# Patient Record
Sex: Male | Born: 1940 | Race: White | Hispanic: No | Marital: Married | Smoking: Never smoker
Health system: Southern US, Community
[De-identification: ages and names within clinical notes are randomized; demographics above are authoritative.]

## PROBLEM LIST (undated history)

## (undated) DIAGNOSIS — E785 Hyperlipidemia, unspecified: Secondary | ICD-10-CM

## (undated) DIAGNOSIS — J029 Acute pharyngitis, unspecified: Secondary | ICD-10-CM

## (undated) DIAGNOSIS — J069 Acute upper respiratory infection, unspecified: Secondary | ICD-10-CM

## (undated) DIAGNOSIS — F329 Major depressive disorder, single episode, unspecified: Secondary | ICD-10-CM

## (undated) DIAGNOSIS — H5440 Blindness, one eye, unspecified eye: Secondary | ICD-10-CM

## (undated) DIAGNOSIS — C801 Malignant (primary) neoplasm, unspecified: Secondary | ICD-10-CM

## (undated) DIAGNOSIS — H539 Unspecified visual disturbance: Secondary | ICD-10-CM

## (undated) DIAGNOSIS — I209 Angina pectoris, unspecified: Secondary | ICD-10-CM

## (undated) DIAGNOSIS — H18519 Endothelial corneal dystrophy, unspecified eye: Secondary | ICD-10-CM

## (undated) DIAGNOSIS — F32A Depression, unspecified: Secondary | ICD-10-CM

## (undated) DIAGNOSIS — M1990 Unspecified osteoarthritis, unspecified site: Secondary | ICD-10-CM

## (undated) DIAGNOSIS — H1851 Endothelial corneal dystrophy: Secondary | ICD-10-CM

## (undated) DIAGNOSIS — C4491 Basal cell carcinoma of skin, unspecified: Secondary | ICD-10-CM

## (undated) HISTORY — DX: Malignant (primary) neoplasm, unspecified: C80.1

## (undated) HISTORY — DX: Depression, unspecified: F32.A

## (undated) HISTORY — DX: Major depressive disorder, single episode, unspecified: F32.9

## (undated) HISTORY — DX: Hyperlipidemia, unspecified: E78.5

## (undated) HISTORY — PX: SKIN CANCER EXCISION: SHX779

## (undated) HISTORY — DX: Acute pharyngitis, unspecified: J02.9

## (undated) HISTORY — DX: Basal cell carcinoma of skin, unspecified: C44.91

## (undated) HISTORY — DX: Blindness, one eye, unspecified eye: H54.40

## (undated) HISTORY — DX: Unspecified visual disturbance: H53.9

## (undated) HISTORY — PX: EYE SURGERY: SHX253

---

## 1964-10-18 HISTORY — PX: HERNIA REPAIR: SHX51

## 1972-10-18 HISTORY — PX: NASAL SINUS SURGERY: SHX719

## 1998-07-16 ENCOUNTER — Encounter: Payer: Self-pay | Admitting: Family Medicine

## 1998-07-16 ENCOUNTER — Ambulatory Visit (HOSPITAL_COMMUNITY): Admission: RE | Admit: 1998-07-16 | Discharge: 1998-07-16 | Payer: Self-pay | Admitting: Family Medicine

## 1998-07-18 ENCOUNTER — Encounter: Admission: RE | Admit: 1998-07-18 | Discharge: 1998-10-16 | Payer: Self-pay | Admitting: Family Medicine

## 2002-05-29 ENCOUNTER — Encounter: Payer: Self-pay | Admitting: Family Medicine

## 2002-05-29 ENCOUNTER — Ambulatory Visit (HOSPITAL_COMMUNITY): Admission: RE | Admit: 2002-05-29 | Discharge: 2002-05-29 | Payer: Self-pay | Admitting: Family Medicine

## 2003-05-07 ENCOUNTER — Encounter: Payer: Self-pay | Admitting: Family Medicine

## 2003-05-07 ENCOUNTER — Ambulatory Visit (HOSPITAL_COMMUNITY): Admission: RE | Admit: 2003-05-07 | Discharge: 2003-05-07 | Payer: Self-pay | Admitting: Family Medicine

## 2011-07-20 ENCOUNTER — Encounter (INDEPENDENT_AMBULATORY_CARE_PROVIDER_SITE_OTHER): Payer: Self-pay | Admitting: Surgery

## 2011-07-23 ENCOUNTER — Ambulatory Visit (INDEPENDENT_AMBULATORY_CARE_PROVIDER_SITE_OTHER): Payer: Medicare Other | Admitting: Surgery

## 2011-08-04 ENCOUNTER — Ambulatory Visit (INDEPENDENT_AMBULATORY_CARE_PROVIDER_SITE_OTHER): Payer: Medicare Other | Admitting: General Surgery

## 2011-08-04 ENCOUNTER — Encounter (INDEPENDENT_AMBULATORY_CARE_PROVIDER_SITE_OTHER): Payer: Self-pay | Admitting: General Surgery

## 2011-08-04 VITALS — BP 138/68 | HR 64 | Temp 97.2°F | Resp 20 | Ht 69.5 in | Wt 193.1 lb

## 2011-08-04 DIAGNOSIS — K429 Umbilical hernia without obstruction or gangrene: Secondary | ICD-10-CM

## 2011-08-04 NOTE — Progress Notes (Signed)
Chief Complaint  Patient presents with  . Other    new pt eval of possible LIH     HPI Michael Pacheco is a 70 y.o. male.  This patient is referred by Dr. Meredith Mody for evaluation of a possible left inguinal hernia. 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 Meredith Mody noticed a possible recurrent hernia in his left inguinal region. He has a history of a prior open left inguinal hernia repair in the 1960s and states that he does not have any problems since then. He denies any bulge in the region or any significant discomfort. He states that his bowels are normal but he does have some occasional constipation which she attributes to his medications. His denies any other obstructive symptoms. HPI  Past Medical History  Diagnosis Date  . Hyperlipidemia   . Allergic rhinitis   . Depression   . Diabetes mellitus     prediadetes  . Blindness of right eye   . Cancer   . Basal cell carcinoma     nose,forehead,ear,shoulder,chest  . Constipation   . Sore throat   . Visual disturbance     Past Surgical History  Procedure Date  . Hernia repair 1966    left  . Nasal sinus surgery 1974    Family History  Problem Relation Age of Onset  . Stroke Mother     Social History History  Substance Use Topics  . Smoking status: Never Smoker   . Smokeless tobacco: Not on file  . Alcohol Use: Yes    Allergies  Allergen Reactions  . Crestor (Rosuvastatin Calcium)   . Lipitor (Atorvastatin Calcium)   . Pravastatin   . Simvastatin   . Talwin Nausea And Vomiting  . Penicillins Rash    Current Outpatient Prescriptions  Medication Sig Dispense Refill  . aspirin 81 MG tablet Take 81 mg by mouth daily.        . beta carotene w/minerals (OCUVITE) tablet Take 1 tablet by mouth daily.        . citalopram (CELEXA) 20 MG tablet Take 20 mg by mouth daily.        Marland Kitchen desonide (DESOWEN) 0.05 % cream       . docusate sodium (COLACE) 100  MG capsule Take 100 mg by mouth once.        . fish oil-omega-3 fatty acids 1000 MG capsule Take 2 g by mouth daily.        Marland Kitchen LIVALO 2 MG TABS       . Red Yeast Rice 600 MG CAPS Take by mouth.          Review of Systems Review of Systems  HENT: Positive for sore throat.   Eyes: Positive for visual disturbance.  Gastrointestinal: Positive for constipation.  All other systems reviewed and are negative.    Blood pressure 138/68, pulse 64, temperature 97.2 F (36.2 C), resp. rate 20, height 5' 9.5" (1.765 m), weight 193 lb 2 oz (87.601 kg).  Physical Exam Physical Exam  Vitals reviewed. Constitutional: He is oriented to person, place, and time. He appears well-developed and well-nourished. No distress.  HENT:  Head: Normocephalic and atraumatic.  Mouth/Throat: No oropharyngeal exudate.  Eyes: Conjunctivae and EOM are normal. Pupils are equal, round, and reactive to light. No scleral icterus.  Neck: Normal range of motion. Neck supple. No tracheal deviation present.  Cardiovascular: Normal rate, regular rhythm and normal heart  sounds.   Pulmonary/Chest: No stridor. No respiratory distress. He has no wheezes. He has no rales. He exhibits no tenderness.  Abdominal: Soft. Bowel sounds are normal. He exhibits no distension and no mass. There is no tenderness. There is no rebound and no guarding.       He has a small, reducible umbilical hernia which is nontender on exam today. Without evidence of recurrent left inguinal hernia and no evidence of right inguinal hernia. He does have a midline diastasis.  Musculoskeletal: Normal range of motion. He exhibits no edema and no tenderness.  Neurological: He is alert and oriented to person, place, and time.  Skin: Skin is warm and dry. No rash noted. He is not diaphoretic. No erythema. No pallor.  Psychiatric: He has a normal mood and affect. His behavior is normal. Judgment and thought content normal.    Data Reviewed   Assessment      Umbilical hernia  This is a reducible and small umbilical hernia. It appears to be a symptomatic at this time and no evidence of obstructive symptoms. He also has a mild diastases.  No evidence of recurrent left inguinal hernia.    Plan    I discussed with the patient the options for treatment of umbilical hernia including surgical options of open or laparoscopic repair. I explained that my official recommendation would be for surgical repair of this relatively small umbilical hernia to prevent complications such as incarceration or strangulation. He expressed understanding of these potential risks. I gave him some literature about hernia surgery and he is going to review the literature and decide if he would like to have repair of this hernia. At this point, he is leaning towards not having surgery. I gave him a contact information and he will call me back if he desires to have hernia repair. For this small hernia, I would recommend open umbilical hernia repair.With regard to his inguinal region, I do not appreciate any hernia bilaterally and would recommend continued observation of this.       Lodema Pilot DAVID 08/04/2011, 9:45 AM

## 2011-09-29 ENCOUNTER — Other Ambulatory Visit (INDEPENDENT_AMBULATORY_CARE_PROVIDER_SITE_OTHER): Payer: Self-pay | Admitting: General Surgery

## 2011-09-29 ENCOUNTER — Telehealth (INDEPENDENT_AMBULATORY_CARE_PROVIDER_SITE_OTHER): Payer: Self-pay

## 2011-09-29 NOTE — Telephone Encounter (Signed)
Patient called and would like to proceed with umbilical hernia repair, spoke with Mr. Pulis and advised patient Dr. Biagio Quint will review and we will call patient back to discuss further.

## 2011-10-13 ENCOUNTER — Encounter (HOSPITAL_COMMUNITY): Payer: Self-pay

## 2011-10-22 ENCOUNTER — Encounter (HOSPITAL_COMMUNITY): Payer: Self-pay

## 2011-10-22 ENCOUNTER — Encounter (HOSPITAL_COMMUNITY)
Admission: RE | Admit: 2011-10-22 | Discharge: 2011-10-22 | Disposition: A | Discharge status: Home / Self Care | Payer: Medicare Other | Source: Ambulatory Visit | Attending: General Surgery | Admitting: General Surgery

## 2011-10-22 HISTORY — DX: Angina pectoris, unspecified: I20.9

## 2011-10-22 HISTORY — DX: Endothelial corneal dystrophy: H18.51

## 2011-10-22 HISTORY — DX: Acute upper respiratory infection, unspecified: J06.9

## 2011-10-22 HISTORY — DX: Unspecified osteoarthritis, unspecified site: M19.90

## 2011-10-22 HISTORY — DX: Endothelial corneal dystrophy, unspecified eye: H18.519

## 2011-10-22 LAB — BASIC METABOLIC PANEL
BUN: 16 mg/dL (ref 6–23)
CO2: 28 mEq/L (ref 19–32)
Chloride: 100 mEq/L (ref 96–112)
Creatinine, Ser: 0.95 mg/dL (ref 0.50–1.35)
Glucose, Bld: 106 mg/dL — ABNORMAL HIGH (ref 70–99)
Potassium: 4.4 mEq/L (ref 3.5–5.1)

## 2011-10-22 LAB — SURGICAL PCR SCREEN: Staphylococcus aureus: NEGATIVE

## 2011-10-22 LAB — CBC
HCT: 45.3 % (ref 39.0–52.0)
Hemoglobin: 15.7 g/dL (ref 13.0–17.0)
MCHC: 34.7 g/dL (ref 30.0–36.0)
MCV: 86.3 fL (ref 78.0–100.0)
RDW: 12.9 % (ref 11.5–15.5)

## 2011-10-22 NOTE — Pre-Procedure Instructions (Signed)
20 Michael Pacheco  10/22/2011   Your procedure is scheduled on:  10/28/2011  Report to [facility] at 9:35 AM.  Call this number if you have problems the morning of surgery: [phone]   Remember:   Do not eat food:After Midnight.  May have clear liquids: up to 4 Hours before arrival.  Clear liquids include soda, tea, black coffee, apple or grape juice, broth.  Take these medicines the morning of surgery with A SIP OF WATER: Celexa   Do not wear jewelry, make-up or nail polish.  Do not wear lotions, powders, or perfumes. You may wear deodorant.  Do not shave 48 hours prior to surgery.  Do not bring valuables to the hospital.  Contacts, dentures or bridgework may not be worn into surgery.  Leave suitcase in the car. After surgery it may be brought to your room.  For patients admitted to the hospital, checkout time is 11:00 AM the day of discharge.   Patients discharged the day of surgery will not be allowed to drive home.  Name and phone number of your driver: wife- Darl Pikes  Special Instructions: CHG Shower Use Special Wash: 1/2 bottle night before surgery and 1/2 bottle morning of surgery.   Please read over the following fact sheets that you were given: Pain Booklet, Coughing and Deep Breathing, MRSA Information and Surgical Site Infection Prevention

## 2011-10-27 MED ORDER — CIPROFLOXACIN IN D5W 400 MG/200ML IV SOLN
400.0000 mg | INTRAVENOUS | Status: AC
  Administered 2011-10-28: 400 mg via INTRAVENOUS
  Filled 2011-10-27: qty 200

## 2011-10-28 ENCOUNTER — Ambulatory Visit (HOSPITAL_COMMUNITY)
Admission: RE | Admit: 2011-10-28 | Discharge: 2011-10-28 | Disposition: A | Discharge status: Home / Self Care | Payer: Medicare Other | Source: Ambulatory Visit | Attending: General Surgery | Admitting: General Surgery

## 2011-10-28 ENCOUNTER — Encounter (HOSPITAL_COMMUNITY): Payer: Self-pay | Admitting: Anesthesiology

## 2011-10-28 ENCOUNTER — Ambulatory Visit (HOSPITAL_COMMUNITY): Payer: Medicare Other | Admitting: Anesthesiology

## 2011-10-28 ENCOUNTER — Encounter (HOSPITAL_COMMUNITY)
Admission: RE | Disposition: A | Discharge status: Home / Self Care | Payer: Self-pay | Source: Ambulatory Visit | Attending: General Surgery

## 2011-10-28 ENCOUNTER — Encounter (HOSPITAL_COMMUNITY): Payer: Self-pay | Admitting: *Deleted

## 2011-10-28 DIAGNOSIS — E785 Hyperlipidemia, unspecified: Secondary | ICD-10-CM | POA: Insufficient documentation

## 2011-10-28 DIAGNOSIS — K429 Umbilical hernia without obstruction or gangrene: Secondary | ICD-10-CM

## 2011-10-28 DIAGNOSIS — Z01812 Encounter for preprocedural laboratory examination: Secondary | ICD-10-CM | POA: Insufficient documentation

## 2011-10-28 DIAGNOSIS — E119 Type 2 diabetes mellitus without complications: Secondary | ICD-10-CM | POA: Insufficient documentation

## 2011-10-28 HISTORY — PX: UMBILICAL HERNIA REPAIR: SHX196

## 2011-10-28 SURGERY — REPAIR, HERNIA, UMBILICAL, ADULT
Anesthesia: General | Site: Abdomen | Wound class: Clean

## 2011-10-28 MED ORDER — ROCURONIUM BROMIDE 100 MG/10ML IV SOLN
INTRAVENOUS | Status: DC | PRN
  Administered 2011-10-28: 5 mg via INTRAVENOUS
  Administered 2011-10-28: 40 mg via INTRAVENOUS

## 2011-10-28 MED ORDER — HYDROCODONE-ACETAMINOPHEN 5-500 MG PO TABS: 1.0000 | ORAL_TABLET | ORAL | Status: AC | PRN

## 2011-10-28 MED ORDER — MIDAZOLAM HCL 5 MG/5ML IJ SOLN
INTRAMUSCULAR | Status: DC | PRN
  Administered 2011-10-28: 2 mg via INTRAVENOUS

## 2011-10-28 MED ORDER — ONDANSETRON HCL 4 MG/2ML IJ SOLN
INTRAMUSCULAR | Status: DC | PRN
  Administered 2011-10-28: 4 mg via INTRAVENOUS

## 2011-10-28 MED ORDER — DROPERIDOL 2.5 MG/ML IJ SOLN: 0.6250 mg | INTRAMUSCULAR | Status: DC | PRN

## 2011-10-28 MED ORDER — LACTATED RINGERS IV SOLN
INTRAVENOUS | Status: DC | PRN
  Administered 2011-10-28 (×2): via INTRAVENOUS

## 2011-10-28 MED ORDER — FENTANYL CITRATE 0.05 MG/ML IJ SOLN
INTRAMUSCULAR | Status: DC | PRN
  Administered 2011-10-28 (×5): 50 ug via INTRAVENOUS

## 2011-10-28 MED ORDER — PROPOFOL 10 MG/ML IV EMUL
INTRAVENOUS | Status: DC | PRN
  Administered 2011-10-28: 150 mg via INTRAVENOUS
  Administered 2011-10-28: 30 mg via INTRAVENOUS

## 2011-10-28 MED ORDER — EPHEDRINE SULFATE 50 MG/ML IJ SOLN
INTRAMUSCULAR | Status: DC | PRN
  Administered 2011-10-28 (×2): 10 mg via INTRAVENOUS

## 2011-10-28 MED ORDER — BUPIVACAINE HCL 0.25 % IJ SOLN
INTRAMUSCULAR | Status: DC | PRN
  Administered 2011-10-28: 13:00:00

## 2011-10-28 MED ORDER — 0.9 % SODIUM CHLORIDE (POUR BTL) OPTIME
TOPICAL | Status: DC | PRN
  Administered 2011-10-28: 1000 mL

## 2011-10-28 MED ORDER — HYDROCODONE-ACETAMINOPHEN 5-325 MG PO TABS
ORAL_TABLET | ORAL | Status: AC
  Filled 2011-10-28: qty 2

## 2011-10-28 MED ORDER — HYDROMORPHONE HCL PF 1 MG/ML IJ SOLN
0.2500 mg | INTRAMUSCULAR | Status: DC | PRN
  Administered 2011-10-28 (×2): 0.25 mg via INTRAVENOUS
  Administered 2011-10-28: 0.5 mg via INTRAVENOUS

## 2011-10-28 MED ORDER — GLYCOPYRROLATE 0.2 MG/ML IJ SOLN
INTRAMUSCULAR | Status: DC | PRN
  Administered 2011-10-28: .6 mg via INTRAVENOUS

## 2011-10-28 MED ORDER — HYDROMORPHONE HCL PF 1 MG/ML IJ SOLN
INTRAMUSCULAR | Status: AC
  Filled 2011-10-28: qty 1

## 2011-10-28 MED ORDER — LACTATED RINGERS IV SOLN
INTRAVENOUS | Status: DC
  Administered 2011-10-28: 12:00:00 via INTRAVENOUS

## 2011-10-28 MED ORDER — NEOSTIGMINE METHYLSULFATE 1 MG/ML IJ SOLN
INTRAMUSCULAR | Status: DC | PRN
  Administered 2011-10-28: 4 mg via INTRAVENOUS

## 2011-10-28 SURGICAL SUPPLY — 53 items
BLADE SURG 10 STRL SS (BLADE) ×2 IMPLANT
BLADE SURG 15 STRL LF DISP TIS (BLADE) ×1 IMPLANT
BLADE SURG 15 STRL SS (BLADE) ×1
BLADE SURG ROTATE 9660 (MISCELLANEOUS) ×2 IMPLANT
CHLORAPREP W/TINT 26ML (MISCELLANEOUS) ×2 IMPLANT
CLOTH BEACON ORANGE TIMEOUT ST (SAFETY) ×2 IMPLANT
COTTON BALL STERILE (GAUZE/BANDAGES/DRESSINGS) ×2
COTTON BALL STERILE 2 PK (GAUZE/BANDAGES/DRESSINGS) ×1 IMPLANT
COVER SURGICAL LIGHT HANDLE (MISCELLANEOUS) ×2 IMPLANT
DECANTER SPIKE VIAL GLASS SM (MISCELLANEOUS) IMPLANT
DERMABOND ADHESIVE PROPEN (GAUZE/BANDAGES/DRESSINGS) ×1
DERMABOND ADVANCED (GAUZE/BANDAGES/DRESSINGS)
DERMABOND ADVANCED .7 DNX12 (GAUZE/BANDAGES/DRESSINGS) IMPLANT
DERMABOND ADVANCED .7 DNX6 (GAUZE/BANDAGES/DRESSINGS) ×1 IMPLANT
DRAPE PED LAPAROTOMY (DRAPES) ×2 IMPLANT
DRSG TEGADERM 4X4.75 (GAUZE/BANDAGES/DRESSINGS) ×2 IMPLANT
ELECT CAUTERY BLADE 6.4 (BLADE) ×2 IMPLANT
ELECT COATED BLADE 2.86 ST (ELECTRODE) ×2 IMPLANT
ELECT REM PT RETURN 9FT ADLT (ELECTROSURGICAL) ×2
ELECTRODE REM PT RTRN 9FT ADLT (ELECTROSURGICAL) ×1 IMPLANT
GLOVE BIO SURGEON STRL SZ7.5 (GLOVE) ×4 IMPLANT
GLOVE BIOGEL PI IND STRL 7.0 (GLOVE) ×1 IMPLANT
GLOVE BIOGEL PI IND STRL 7.5 (GLOVE) ×2 IMPLANT
GLOVE BIOGEL PI INDICATOR 7.0 (GLOVE) ×1
GLOVE BIOGEL PI INDICATOR 7.5 (GLOVE) ×2
GLOVE SURG SS PI 7.5 STRL IVOR (GLOVE) ×4 IMPLANT
GOWN PREVENTION PLUS XLARGE (GOWN DISPOSABLE) ×2 IMPLANT
GOWN STRL NON-REIN LRG LVL3 (GOWN DISPOSABLE) ×4 IMPLANT
KIT BASIN OR (CUSTOM PROCEDURE TRAY) ×2 IMPLANT
KIT ROOM TURNOVER OR (KITS) ×2 IMPLANT
NEEDLE HYPO 25GX1X1/2 BEV (NEEDLE) ×2 IMPLANT
NS IRRIG 1000ML POUR BTL (IV SOLUTION) ×2 IMPLANT
PACK SURGICAL SETUP 50X90 (CUSTOM PROCEDURE TRAY) ×2 IMPLANT
PAD ARMBOARD 7.5X6 YLW CONV (MISCELLANEOUS) ×4 IMPLANT
PATCH VENTRAL SMALL 4.3 (Mesh Specialty) ×2 IMPLANT
PENCIL BUTTON HOLSTER BLD 10FT (ELECTRODE) ×2 IMPLANT
SPONGE LAP 18X18 X RAY DECT (DISPOSABLE) ×2 IMPLANT
SUT ETHIBOND 0 MO6 C/R (SUTURE) ×2 IMPLANT
SUT MNCRL AB 4-0 PS2 18 (SUTURE) ×2 IMPLANT
SUT PROLENE 2 0 CT2 30 (SUTURE) IMPLANT
SUT PROLENE 2 0 SH DA (SUTURE) ×8 IMPLANT
SUT VIC AB 3-0 SH 27 (SUTURE) ×2
SUT VIC AB 3-0 SH 27X BRD (SUTURE) ×2 IMPLANT
SUT VIC AB 3-0 SH 27XBRD (SUTURE) IMPLANT
SYR BULB 3OZ (MISCELLANEOUS) ×2 IMPLANT
TOWEL OR 17X24 6PK STRL BLUE (TOWEL DISPOSABLE) ×2 IMPLANT
TOWEL OR 17X26 10 PK STRL BLUE (TOWEL DISPOSABLE) ×2 IMPLANT
TOWEL OR NON WOVEN STRL DISP B (DISPOSABLE) ×2 IMPLANT
TRAY LAPAROSCOPIC (CUSTOM PROCEDURE TRAY) IMPLANT
TROCAR XCEL NON-BLD 5MMX100MML (ENDOMECHANICALS) IMPLANT
TUBE CONNECTING 12X1/4 (SUCTIONS) ×2 IMPLANT
WATER STERILE IRR 1000ML POUR (IV SOLUTION) IMPLANT
YANKAUER SUCT BULB TIP NO VENT (SUCTIONS) ×2 IMPLANT

## 2011-10-28 NOTE — Transfer of Care (Signed)
Immediate Anesthesia Transfer of Care Note  Patient: Michael Pacheco  Procedure(s) Performed:  HERNIA REPAIR UMBILICAL ADULT - Open umbilical hernia repair with mesh  Patient Location: PACU  Anesthesia Type: General  Level of Consciousness: awake, alert , oriented and sedated  Airway & Oxygen Therapy: Patient Spontanous Breathing and Patient connected to nasal cannula oxygen  Post-op Assessment: Report given to PACU RN, Post -op Vital signs reviewed and stable and Patient moving all extremities  Post vital signs: Reviewed and stable Filed Vitals:   10/28/11 0947  BP: 152/70  Pulse: 62  Temp: 36.7 C  Resp: 20    Complications: No apparent anesthesia complications

## 2011-10-28 NOTE — Preoperative (Signed)
Beta Blockers   Reason not to administer Beta Blockers:Not Applicable 

## 2011-10-28 NOTE — Progress Notes (Signed)
Patient states he is not Diabetic.

## 2011-10-28 NOTE — Progress Notes (Signed)
Dr. Krista Blue notified of wedding ring on left hand that will not come off.

## 2011-10-28 NOTE — H&P (Signed)
HPI  Michael Pacheco is a 71 y.o. male. This patient is referred by Dr. Meredith Mody for evaluation of a possible left inguinal hernia. 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 Meredith Mody noticed a possible recurrent hernia in his left inguinal region. He has a history of a prior open left inguinal hernia repair in the 1960s and states that he does not have any problems since then. He denies any bulge in the region or any significant discomfort. He states that his bowels are normal but he does have some occasional constipation which she attributes to his medications. His denies any other obstructive symptoms.  HPI  Past Medical History   Diagnosis  Date   .  Hyperlipidemia    .  Allergic rhinitis    .  Depression    .  Diabetes mellitus      prediadetes   .  Blindness of right eye    .  Cancer    .  Basal cell carcinoma      nose,forehead,ear,shoulder,chest   .  Constipation    .  Sore throat    .  Visual disturbance     Past Surgical History   Procedure  Date   .  Hernia repair  1966     left   .  Nasal sinus surgery  1974    Family History   Problem  Relation  Age of Onset   .  Stroke  Mother     Social History  History   Substance Use Topics   .  Smoking status:  Never Smoker   .  Smokeless tobacco:  Not on file   .  Alcohol Use:  Yes    Allergies   Allergen  Reactions   .  Crestor (Rosuvastatin Calcium)    .  Lipitor (Atorvastatin Calcium)    .  Pravastatin    .  Simvastatin    .  Talwin  Nausea And Vomiting   .  Penicillins  Rash    Current Outpatient Prescriptions   Medication  Sig  Dispense  Refill   .  aspirin 81 MG tablet  Take 81 mg by mouth daily.     .  beta carotene w/minerals (OCUVITE) tablet  Take 1 tablet by mouth daily.     .  citalopram (CELEXA) 20 MG tablet  Take 20 mg by mouth daily.     Marland Kitchen  desonide (DESOWEN) 0.05 % cream      .  docusate sodium (COLACE) 100 MG capsule  Take 100 mg  by mouth once.     .  fish oil-omega-3 fatty acids 1000 MG capsule  Take 2 g by mouth daily.     Marland Kitchen  LIVALO 2 MG TABS      .  Red Yeast Rice 600 MG CAPS  Take by mouth.      Review of Systems  Review of Systems  HENT: Positive for sore throat.  Eyes: Positive for visual disturbance.  Gastrointestinal: Positive for constipation.  All other systems reviewed and are negative.     Physical Exam  Physical Exam  Vitals reviewed.  Constitutional: He is oriented to person, place, and time. He appears well-developed and well-nourished. No distress.  HENT:  Head: Normocephalic and atraumatic.  Mouth/Throat: No oropharyngeal exudate.  Eyes: Conjunctivae and EOM are normal. Pupils are equal, round, and reactive to light. No scleral icterus.  Neck:  Normal range of motion. Neck supple. No tracheal deviation present.  Cardiovascular: Normal rate, regular rhythm and normal heart sounds.  Pulmonary/Chest: No stridor. No respiratory distress. He has no wheezes. He has no rales. He exhibits no tenderness.  Abdominal: Soft. Bowel sounds are normal. He exhibits no distension and no mass. There is no tenderness. There is no rebound and no guarding.  He has a small, reducible umbilical hernia which is nontender on exam today. Without evidence of recurrent left inguinal hernia and no evidence of right inguinal hernia. He does have a midline diastasis.  Musculoskeletal: Normal range of motion. He exhibits no edema and no tenderness.  Neurological: He is alert and oriented to person, place, and time.  Skin: Skin is warm and dry. No rash noted. He is not diaphoretic. No erythema. No pallor.  Psychiatric: He has a normal mood and affect. His behavior is normal. Judgment and thought content normal.   Data Reviewed  Assessment   Umbilical hernia  This is a reducible and small umbilical hernia. It appears to be a symptomatic at this time and no evidence of obstructive symptoms. He also has a mild diastases.  No  evidence of recurrent left inguinal hernia.   Plan Pt seen and evaluated.  Site marked.  Risks of procedure again discussed including infection, bleeding, pain, scarring, recurrence, bowel injury and need for reoperation. He expressed understanding and desires to proceed with open umbilical hernia repair with possible mesh.

## 2011-10-28 NOTE — Op Note (Signed)
NAME:  Michael Pacheco, Michael Pacheco NO.:  [id number]  MEDICAL RECORD NO.:  [id number]  LOCATION:  [facility]                         FACILITY:  [facility]  PHYSICIAN:  Lodema Pilot, MD       DATE OF BIRTH:  [birth year]  DATE OF PROCEDURE:  10/28/2011 DATE OF DISCHARGE:  10/28/2011                              OPERATIVE REPORT   PROCEDURE:  Open umbilical hernia repair with mesh.  PREOPERATIVE DIAGNOSIS:  Umbilical hernia.  POSTOPERATIVE DIAGNOSIS:  Umbilical hernia.  SURGEON:  Lodema Pilot, MD  ASSISTANT:  None.  ANESTHESIA:  General endotracheal anesthesia with 30 mL of 1% lidocaine with epinephrine, 0.25% Marcaine in a 50:50 mixture.  FLUID:  1400 mL crystalloid.  ESTIMATED BLOOD LOSS:  Minimal.  DRAINS:  None.  SPECIMENS:  None.  COMPLICATIONS:  None apparent.  FINDINGS:  A 2-cm fascial defect with preperitoneal fat.  No evidence of bowel contents, placement of 4.3 cm x 4.3 cm Proceed ventral patch.  INDICATIONS FOR PROCEDURE:  Michael Pacheco is a 71 year old male who had a reducible umbilical hernia on exam, and had been becoming more symptomatic.  He states today prior to his procedure that he had been having some discomfort in the area and some occasional nausea.  OPERATIVE DETAILS:  Michael Pacheco is seen and evaluated in the preoperative area and risks and benefits of procedure were again discussed in lay terms.  Informed consent was obtained.  Surgical site was marked prior to anesthetic administration.  Prophylactic antibiotics were given.  He was taken to the operating room, placed on the table supine position.  General endotracheal anesthesia was obtained, and then was prepped and draped in standard surgical fashion and procedure time- out was performed with all operative team members to confirm proper patient, procedure, and a semicircular infraumbilical incision was made in the skin and dissection carried down to the subcutaneous tissue using Bovie  electrocautery.  The abdominal wall fascia was identified and opened and the fat was cleared from the fascia circumferentially.  A Kelly clamp was able to pass around the umbilical stalk and the umbilical stalk was taken off of the hernia sac.  Then the edge of the fascia was dissected circumferentially with a right-angle freeing up the hernia contents and allowing me to reduce the contents easily into the abdomen.  He had approximately 2-cm fascial defect, and the region, planning on closing this primarily, although his fascia did not appear to be the strongest fascia and so I decided to put a 4.2 cm x 4.2 cm Proceed ventral patch in the preperitoneal space  under the fascia. This was sutured in place, undersurface of the fascia was cleared of the fatty attachments and to create a space for the mesh and double-armed 2- 0 Prolene sutures were placed through the mesh at the 12 o'clock, 3 o'clock, 6 o'clock, and 9 o'clock, and the sutures were parachuted up through the fascia under direct visualization at each of these positions and the mesh was placed into the preperitoneal space and appeared to lie flat and cover the defect with overlap.  Sutures were then secured and wound was irrigated with sterile saline solution and the wound was noted  to be hemostatic and the hernia defect was approximated over the mesh with interrupted 0 Ethibond sutures taking care to avoid injury to underlying bowel content, although there was nothing under the fascias, the mesh was shielding any peritoneal structures.  The tails of the mesh were captured with the Ethibond sutures keeping the mesh adherent to the abdominal wall, and the fascial defect was approximated over the mesh. The wound was irrigated again with sterile saline solution and then the wound was noted be hemostatic.  The wound was then irrigated with total of 30 mL of 1% lidocaine with epinephrine, 0.25% Marcaine in a 50:50 mixture, and two 3-0  Prolene sutures were used to tack the base of the umbilicus to the underlying fascia and the dermis was approximated with interrupted 3-0 Vicryl sutures and the skin edges were approximated with 4-0 Monocryl subcuticular suture.  Skin was washed and dried and Dermabond was applied.  Sterile suction dressing was applied.  All sponge, needle, and instrument counts were correct at the end of the case.  The patient tolerated procedure well without apparent complication.          ______________________________ Lodema Pilot, MD     BL/MEDQ  D:  10/28/2011  T:  10/28/2011  Job:  [id number]

## 2011-10-28 NOTE — Anesthesia Preprocedure Evaluation (Addendum)
Anesthesia Evaluation  Patient identified by MRN, date of birth, ID band Patient awake    Reviewed: Allergy & Precautions, H&P , NPO status , Patient's Chart, lab work & pertinent test results  History of Anesthesia Complications Negative for: history of anesthetic complications  Airway Mallampati: II TM Distance: >3 FB Neck ROM: Limited    Dental   Pulmonary Recent URI , Resolved,  clear to auscultation  Pulmonary exam normal       Cardiovascular - anginaRegular Normal- Systolic murmurs    Neuro/Psych PSYCHIATRIC DISORDERS Depression Negative Neurological ROS     GI/Hepatic Neg liver ROS,   Endo/Other  Diabetes mellitus-  Renal/GU negative Renal ROS     Musculoskeletal   Abdominal   Peds  Hematology   Anesthesia Other Findings   Reproductive/Obstetrics                          Anesthesia Physical Anesthesia Plan  ASA: II  Anesthesia Plan: General   Post-op Pain Management:    Induction: Intravenous  Airway Management Planned: Oral ETT  Additional Equipment:   Intra-op Plan:   Post-operative Plan:   Informed Consent: I have reviewed the patients History and Physical, chart, labs and discussed the procedure including the risks, benefits and alternatives for the proposed anesthesia with the patient or authorized representative who has indicated his/her understanding and acceptance.     Plan Discussed with: CRNA, Anesthesiologist and Surgeon  Anesthesia Plan Comments:         Anesthesia Quick Evaluation

## 2011-10-28 NOTE — OR Nursing (Signed)
Doing well with coughing/deep breathing and using pillow to support abd / has tolerated po fluids well  W/out nausea

## 2011-10-28 NOTE — Brief Op Note (Signed)
10/28/2011  1:54 PM  PATIENT:  Michael Pacheco  71 y.o. male  PRE-OPERATIVE DIAGNOSIS:  Umbilical hernia  POST-OPERATIVE DIAGNOSIS:  Umbilical hernia  PROCEDURE:  Procedure(s): HERNIA REPAIR UMBILICAL ADULT  SURGEON:  Surgeon(s): Rulon Abide, DO  PHYSICIAN ASSISTANT:   ASSISTANTS: none   ANESTHESIA:   general  EBL:  Total I/O In: 1400 [I.V.:1400] Out: -   BLOOD ADMINISTERED:none  DRAINS: none   LOCAL MEDICATIONS USED:  MARCAINE 15CC and LIDOCAINE 15CC  SPECIMEN:  No Specimen  DISPOSITION OF SPECIMEN:  N/A  COUNTS:  YES  TOURNIQUET:  * No tourniquets in log *  DICTATION: .Other Dictation: Dictation Number [id number]  PLAN OF CARE: Discharge to home after PACU  PATIENT DISPOSITION:  PACU - hemodynamically stable.   Delay start of Pharmacological VTE agent (>24hrs) due to surgical blood loss or risk of bleeding:  {YES/NO/NOT APPLICABLE:20182

## 2011-10-29 ENCOUNTER — Encounter (HOSPITAL_COMMUNITY): Payer: Self-pay | Admitting: General Surgery

## 2011-10-29 NOTE — Anesthesia Postprocedure Evaluation (Signed)
Anesthesia Post Note  Patient: Michael Pacheco  Procedure(s) Performed:  HERNIA REPAIR UMBILICAL ADULT - Open umbilical hernia repair with mesh  Anesthesia type: general  Patient location: PACU  Post pain: Pain level controlled  Post assessment: Patient's Cardiovascular Status Stable  Last Vitals:  Filed Vitals:   10/28/11 1541  BP: 145/77  Pulse: 64  Temp: 37.3 C  Resp: 16    Post vital signs: Reviewed and stable  Level of consciousness: sedated  Complications: No apparent anesthesia complications

## 2011-11-02 ENCOUNTER — Ambulatory Visit (INDEPENDENT_AMBULATORY_CARE_PROVIDER_SITE_OTHER): Payer: Medicare Other | Admitting: General Surgery

## 2011-11-02 ENCOUNTER — Encounter (INDEPENDENT_AMBULATORY_CARE_PROVIDER_SITE_OTHER): Payer: Self-pay | Admitting: General Surgery

## 2011-11-02 VITALS — BP 130/68 | HR 70 | Temp 97.8°F | Resp 18 | Ht 69.5 in | Wt 193.4 lb

## 2011-11-02 DIAGNOSIS — Z5189 Encounter for other specified aftercare: Secondary | ICD-10-CM

## 2011-11-02 DIAGNOSIS — Z4889 Encounter for other specified surgical aftercare: Secondary | ICD-10-CM

## 2011-11-02 NOTE — Progress Notes (Signed)
Subjective:     Patient ID: Michael Pacheco, male   DOB: Dec 15, 1940, 71 y.o.   MRN: [id number]  HPI This patient comes in today for wound check less than a week out from open umbilical hernia repair with mesh. He was concerned that had some bruising in the area and had some greenish drainage. He said he overdid it a little bit after after his procedure and he had some discomfort but his main complaint is that of constipation. He is taking milk of magnesia and MiraLax for this and started moving his bowels yesterday. He is nearly off the pain medication he states his pain is getting better he is not any fevers or chills or redness around his wound.  Review of Systems     Objective:   Physical Exam His wound looks okay without any sign of infection. He did have some bruising inferior to this incision in the dependent area but this is resolving. There is no evidence of erythema or active infection.    Assessment:     Status post open umbilical hernia repair with mesh-looks okay I did not see any evidence of infection and I think his wound looks okay.    Plan:     He'll follow up in 2 weeks for repeat evaluation.

## 2011-11-19 ENCOUNTER — Encounter (INDEPENDENT_AMBULATORY_CARE_PROVIDER_SITE_OTHER): Payer: Medicare Other | Admitting: General Surgery

## 2011-12-03 ENCOUNTER — Encounter (INDEPENDENT_AMBULATORY_CARE_PROVIDER_SITE_OTHER): Payer: Self-pay | Admitting: General Surgery

## 2011-12-03 ENCOUNTER — Ambulatory Visit (INDEPENDENT_AMBULATORY_CARE_PROVIDER_SITE_OTHER): Payer: Medicare Other | Admitting: General Surgery

## 2011-12-03 VITALS — BP 124/80 | HR 75 | Resp 16 | Ht 69.5 in | Wt 192.6 lb

## 2011-12-03 DIAGNOSIS — Z5189 Encounter for other specified aftercare: Secondary | ICD-10-CM

## 2011-12-03 DIAGNOSIS — Z4889 Encounter for other specified surgical aftercare: Secondary | ICD-10-CM

## 2011-12-03 NOTE — Progress Notes (Signed)
Subjective:     Patient ID: Michael Pacheco, male   DOB: 08/14/1941, 71 y.o.   MRN: [id number]  HPI The patient follows up one month status post open umbilical hernia with Mesh. He states is doing very well has no complaints and no issues. He is off pain medication is no discomfort in area he is tolerating regular diet and his bowels are functioning normally.   Review of Systems     Objective:   Physical Exam Is soft and nontender and nondistended his incision is healing well without sign of infection. There is no evidence of recurrence he has a good cosmetic result.    Assessment:     Status post open vocal hernia repair with mesh has been doing well    Plan:     I think he is far enough out that he can increase his activity as tolerated and follow up with me on a p.r.n. basis.

## 2015-09-02 LAB — GLUCOSE, POCT (MANUAL RESULT ENTRY): POC GLUCOSE: 94 mg/dL (ref 70–99)

## 2015-11-25 DIAGNOSIS — F419 Anxiety disorder, unspecified: Secondary | ICD-10-CM | POA: Diagnosis not present

## 2015-11-25 DIAGNOSIS — R739 Hyperglycemia, unspecified: Secondary | ICD-10-CM | POA: Diagnosis not present

## 2015-11-25 DIAGNOSIS — Z0000 Encounter for general adult medical examination without abnormal findings: Secondary | ICD-10-CM | POA: Diagnosis not present

## 2015-11-25 DIAGNOSIS — Z23 Encounter for immunization: Secondary | ICD-10-CM | POA: Diagnosis not present

## 2015-11-25 DIAGNOSIS — E785 Hyperlipidemia, unspecified: Secondary | ICD-10-CM | POA: Diagnosis not present

## 2015-11-25 DIAGNOSIS — Z125 Encounter for screening for malignant neoplasm of prostate: Secondary | ICD-10-CM | POA: Diagnosis not present

## 2015-12-10 DIAGNOSIS — L821 Other seborrheic keratosis: Secondary | ICD-10-CM | POA: Diagnosis not present

## 2015-12-10 DIAGNOSIS — C44722 Squamous cell carcinoma of skin of right lower limb, including hip: Secondary | ICD-10-CM | POA: Diagnosis not present

## 2015-12-10 DIAGNOSIS — L820 Inflamed seborrheic keratosis: Secondary | ICD-10-CM | POA: Diagnosis not present

## 2015-12-10 DIAGNOSIS — D225 Melanocytic nevi of trunk: Secondary | ICD-10-CM | POA: Diagnosis not present

## 2015-12-31 DIAGNOSIS — H26492 Other secondary cataract, left eye: Secondary | ICD-10-CM | POA: Diagnosis not present

## 2015-12-31 DIAGNOSIS — Z961 Presence of intraocular lens: Secondary | ICD-10-CM | POA: Diagnosis not present

## 2016-01-07 DIAGNOSIS — Z961 Presence of intraocular lens: Secondary | ICD-10-CM | POA: Diagnosis not present

## 2016-01-14 DIAGNOSIS — Z86010 Personal history of colonic polyps: Secondary | ICD-10-CM | POA: Diagnosis not present

## 2016-02-26 DIAGNOSIS — L02212 Cutaneous abscess of back [any part, except buttock]: Secondary | ICD-10-CM | POA: Diagnosis not present

## 2016-02-26 DIAGNOSIS — L720 Epidermal cyst: Secondary | ICD-10-CM | POA: Diagnosis not present

## 2016-05-19 DIAGNOSIS — L729 Follicular cyst of the skin and subcutaneous tissue, unspecified: Secondary | ICD-10-CM | POA: Diagnosis not present

## 2016-06-01 DIAGNOSIS — E7800 Pure hypercholesterolemia, unspecified: Secondary | ICD-10-CM | POA: Diagnosis not present

## 2016-06-01 DIAGNOSIS — F419 Anxiety disorder, unspecified: Secondary | ICD-10-CM | POA: Diagnosis not present

## 2016-06-01 DIAGNOSIS — R7303 Prediabetes: Secondary | ICD-10-CM | POA: Diagnosis not present

## 2016-07-29 DIAGNOSIS — H5212 Myopia, left eye: Secondary | ICD-10-CM | POA: Diagnosis not present

## 2016-08-04 DIAGNOSIS — Z23 Encounter for immunization: Secondary | ICD-10-CM | POA: Diagnosis not present

## 2016-09-02 DIAGNOSIS — R7301 Impaired fasting glucose: Secondary | ICD-10-CM | POA: Diagnosis not present

## 2016-11-25 DIAGNOSIS — R7303 Prediabetes: Secondary | ICD-10-CM | POA: Diagnosis not present

## 2016-11-25 DIAGNOSIS — F419 Anxiety disorder, unspecified: Secondary | ICD-10-CM | POA: Diagnosis not present

## 2016-11-25 DIAGNOSIS — Z0000 Encounter for general adult medical examination without abnormal findings: Secondary | ICD-10-CM | POA: Diagnosis not present

## 2016-11-25 DIAGNOSIS — Z125 Encounter for screening for malignant neoplasm of prostate: Secondary | ICD-10-CM | POA: Diagnosis not present

## 2016-11-25 DIAGNOSIS — E7800 Pure hypercholesterolemia, unspecified: Secondary | ICD-10-CM | POA: Diagnosis not present

## 2016-12-10 DIAGNOSIS — R21 Rash and other nonspecific skin eruption: Secondary | ICD-10-CM | POA: Diagnosis not present

## 2016-12-10 DIAGNOSIS — J019 Acute sinusitis, unspecified: Secondary | ICD-10-CM | POA: Diagnosis not present

## 2017-04-15 DIAGNOSIS — L089 Local infection of the skin and subcutaneous tissue, unspecified: Secondary | ICD-10-CM | POA: Diagnosis not present

## 2017-04-15 DIAGNOSIS — L729 Follicular cyst of the skin and subcutaneous tissue, unspecified: Secondary | ICD-10-CM | POA: Diagnosis not present

## 2017-04-18 DIAGNOSIS — L729 Follicular cyst of the skin and subcutaneous tissue, unspecified: Secondary | ICD-10-CM | POA: Diagnosis not present

## 2017-04-18 DIAGNOSIS — L089 Local infection of the skin and subcutaneous tissue, unspecified: Secondary | ICD-10-CM | POA: Diagnosis not present

## 2017-06-01 DIAGNOSIS — E7800 Pure hypercholesterolemia, unspecified: Secondary | ICD-10-CM | POA: Diagnosis not present

## 2017-06-01 DIAGNOSIS — F419 Anxiety disorder, unspecified: Secondary | ICD-10-CM | POA: Diagnosis not present

## 2017-06-01 DIAGNOSIS — R7303 Prediabetes: Secondary | ICD-10-CM | POA: Diagnosis not present

## 2017-06-01 DIAGNOSIS — M545 Low back pain: Secondary | ICD-10-CM | POA: Diagnosis not present

## 2017-07-05 DIAGNOSIS — Z23 Encounter for immunization: Secondary | ICD-10-CM | POA: Diagnosis not present

## 2017-07-05 DIAGNOSIS — E119 Type 2 diabetes mellitus without complications: Secondary | ICD-10-CM | POA: Diagnosis not present

## 2017-08-05 DIAGNOSIS — J0100 Acute maxillary sinusitis, unspecified: Secondary | ICD-10-CM | POA: Diagnosis not present

## 2017-11-08 DIAGNOSIS — R42 Dizziness and giddiness: Secondary | ICD-10-CM | POA: Diagnosis not present

## 2017-11-08 DIAGNOSIS — H6123 Impacted cerumen, bilateral: Secondary | ICD-10-CM | POA: Diagnosis not present

## 2017-11-08 DIAGNOSIS — H6980 Other specified disorders of Eustachian tube, unspecified ear: Secondary | ICD-10-CM | POA: Diagnosis not present

## 2017-11-21 DIAGNOSIS — Z961 Presence of intraocular lens: Secondary | ICD-10-CM | POA: Diagnosis not present

## 2017-11-24 DIAGNOSIS — L723 Sebaceous cyst: Secondary | ICD-10-CM | POA: Diagnosis not present

## 2017-11-24 DIAGNOSIS — L089 Local infection of the skin and subcutaneous tissue, unspecified: Secondary | ICD-10-CM | POA: Diagnosis not present

## 2017-12-05 DIAGNOSIS — Z0000 Encounter for general adult medical examination without abnormal findings: Secondary | ICD-10-CM | POA: Diagnosis not present

## 2017-12-05 DIAGNOSIS — E119 Type 2 diabetes mellitus without complications: Secondary | ICD-10-CM | POA: Diagnosis not present

## 2017-12-05 DIAGNOSIS — E7800 Pure hypercholesterolemia, unspecified: Secondary | ICD-10-CM | POA: Diagnosis not present

## 2017-12-05 DIAGNOSIS — L03312 Cellulitis of back [any part except buttock]: Secondary | ICD-10-CM | POA: Diagnosis not present

## 2017-12-05 DIAGNOSIS — F419 Anxiety disorder, unspecified: Secondary | ICD-10-CM | POA: Diagnosis not present

## 2017-12-05 DIAGNOSIS — Z125 Encounter for screening for malignant neoplasm of prostate: Secondary | ICD-10-CM | POA: Diagnosis not present

## 2018-03-15 DIAGNOSIS — H43812 Vitreous degeneration, left eye: Secondary | ICD-10-CM | POA: Diagnosis not present

## 2018-04-25 DIAGNOSIS — L723 Sebaceous cyst: Secondary | ICD-10-CM | POA: Diagnosis not present

## 2018-04-25 DIAGNOSIS — E785 Hyperlipidemia, unspecified: Secondary | ICD-10-CM | POA: Diagnosis not present

## 2018-05-03 DIAGNOSIS — X32XXXD Exposure to sunlight, subsequent encounter: Secondary | ICD-10-CM | POA: Diagnosis not present

## 2018-05-03 DIAGNOSIS — L821 Other seborrheic keratosis: Secondary | ICD-10-CM | POA: Diagnosis not present

## 2018-05-03 DIAGNOSIS — L218 Other seborrheic dermatitis: Secondary | ICD-10-CM | POA: Diagnosis not present

## 2018-05-03 DIAGNOSIS — L570 Actinic keratosis: Secondary | ICD-10-CM | POA: Diagnosis not present

## 2018-05-03 DIAGNOSIS — D1801 Hemangioma of skin and subcutaneous tissue: Secondary | ICD-10-CM | POA: Diagnosis not present

## 2018-05-03 DIAGNOSIS — C44319 Basal cell carcinoma of skin of other parts of face: Secondary | ICD-10-CM | POA: Diagnosis not present

## 2018-05-03 DIAGNOSIS — D225 Melanocytic nevi of trunk: Secondary | ICD-10-CM | POA: Diagnosis not present

## 2018-05-03 DIAGNOSIS — C44311 Basal cell carcinoma of skin of nose: Secondary | ICD-10-CM | POA: Diagnosis not present

## 2018-05-15 DIAGNOSIS — H43812 Vitreous degeneration, left eye: Secondary | ICD-10-CM | POA: Diagnosis not present

## 2018-07-04 DIAGNOSIS — J019 Acute sinusitis, unspecified: Secondary | ICD-10-CM | POA: Diagnosis not present

## 2018-07-18 DIAGNOSIS — J069 Acute upper respiratory infection, unspecified: Secondary | ICD-10-CM | POA: Diagnosis not present

## 2018-11-01 DIAGNOSIS — M545 Low back pain: Secondary | ICD-10-CM | POA: Diagnosis not present

## 2018-11-01 DIAGNOSIS — E119 Type 2 diabetes mellitus without complications: Secondary | ICD-10-CM | POA: Diagnosis not present

## 2018-11-01 DIAGNOSIS — F419 Anxiety disorder, unspecified: Secondary | ICD-10-CM | POA: Diagnosis not present

## 2018-11-01 DIAGNOSIS — E7800 Pure hypercholesterolemia, unspecified: Secondary | ICD-10-CM | POA: Diagnosis not present

## 2018-12-20 DIAGNOSIS — Z85828 Personal history of other malignant neoplasm of skin: Secondary | ICD-10-CM | POA: Diagnosis not present

## 2018-12-20 DIAGNOSIS — L720 Epidermal cyst: Secondary | ICD-10-CM | POA: Diagnosis not present

## 2018-12-20 DIAGNOSIS — C44311 Basal cell carcinoma of skin of nose: Secondary | ICD-10-CM | POA: Diagnosis not present

## 2018-12-20 DIAGNOSIS — C44729 Squamous cell carcinoma of skin of left lower limb, including hip: Secondary | ICD-10-CM | POA: Diagnosis not present

## 2018-12-20 DIAGNOSIS — Z08 Encounter for follow-up examination after completed treatment for malignant neoplasm: Secondary | ICD-10-CM | POA: Diagnosis not present

## 2018-12-20 DIAGNOSIS — L218 Other seborrheic dermatitis: Secondary | ICD-10-CM | POA: Diagnosis not present

## 2019-02-12 DIAGNOSIS — M79641 Pain in right hand: Secondary | ICD-10-CM | POA: Diagnosis not present

## 2019-06-12 DIAGNOSIS — F419 Anxiety disorder, unspecified: Secondary | ICD-10-CM | POA: Diagnosis not present

## 2019-06-12 DIAGNOSIS — Z23 Encounter for immunization: Secondary | ICD-10-CM | POA: Diagnosis not present

## 2019-06-12 DIAGNOSIS — Z0000 Encounter for general adult medical examination without abnormal findings: Secondary | ICD-10-CM | POA: Diagnosis not present

## 2019-06-12 DIAGNOSIS — R7303 Prediabetes: Secondary | ICD-10-CM | POA: Diagnosis not present

## 2019-06-12 DIAGNOSIS — E7800 Pure hypercholesterolemia, unspecified: Secondary | ICD-10-CM | POA: Diagnosis not present

## 2019-06-18 DIAGNOSIS — H8110 Benign paroxysmal vertigo, unspecified ear: Secondary | ICD-10-CM | POA: Diagnosis not present

## 2019-06-18 DIAGNOSIS — R1110 Vomiting, unspecified: Secondary | ICD-10-CM | POA: Diagnosis not present

## 2019-06-21 ENCOUNTER — Encounter: Payer: Self-pay | Admitting: Rehabilitative and Restorative Service Providers"

## 2019-06-21 ENCOUNTER — Ambulatory Visit: Payer: PPO | Attending: Family Medicine | Admitting: Rehabilitative and Restorative Service Providers"

## 2019-06-21 ENCOUNTER — Other Ambulatory Visit: Payer: Self-pay

## 2019-06-21 DIAGNOSIS — R42 Dizziness and giddiness: Secondary | ICD-10-CM | POA: Diagnosis not present

## 2019-06-21 DIAGNOSIS — R2681 Unsteadiness on feet: Secondary | ICD-10-CM | POA: Diagnosis not present

## 2019-06-21 DIAGNOSIS — R2689 Other abnormalities of gait and mobility: Secondary | ICD-10-CM | POA: Insufficient documentation

## 2019-06-21 NOTE — Patient Instructions (Signed)
Access Code: [email or web address]  URL: [email or web address]  Date: 06/21/2019  Prepared by: Rudell Cobb   Exercises Standing Gaze Stabilization with Head Rotation - 1 reps - 1 sets - 30 seconds hold - 2x daily - 7x weekly Tandem Stance with Support - 3 reps - 1 sets - 30 seconds hold - 2x daily - 7x weekly Romberg Stance with Head Rotation - 10 reps - 1 sets - 2x daily - 7x weekly

## 2019-06-22 ENCOUNTER — Telehealth: Payer: Self-pay | Admitting: Rehabilitative and Restorative Service Providers"

## 2019-06-22 NOTE — Telephone Encounter (Signed)
Michael Pacheco, [place] called he asked if you could call him back [phone] he forgot to tell you something that was important.   PT called 9/4 @ 832 am and left message.  Pierrette Scheu, PT

## 2019-06-22 NOTE — Therapy (Signed)
[facility] [address] [place], [place], [address] Phone: [phone]   Fax:  [phone]  Physical Therapy Evaluation  Patient Details  Name: BENEDICT BONEE MRN: [id number] Date of Birth: 1941/07/01 Referring Provider (PT): Shirline Frees, MD   Encounter Date: 06/21/2019  PT End of Session - 06/22/19 1208    Visit Number  1    Number of Visits  6    Date for PT Re-Evaluation  08/21/19    Authorization Type  [employer]    PT Start Time  [id number]    PT Stop Time  1532    PT Time Calculation (min)  38 min       Past Medical History:  Diagnosis Date  . Allergic rhinitis   . Angina    reports that it was stress related but they did  a stress test- wnl    . Arthritis    arms, shoulders, back  . Basal cell carcinoma    nose,forehead,ear,shoulder,chest  . Blindness of right eye   . Cancer ([place])   . Constipation   . Cornea guttata    L eye  . Depression   . Diabetes mellitus    prediadetes  . Hyperlipidemia   . Recurrent upper respiratory infection (URI)    common old- a week ago, resolved, tx./w OTC   . Sore throat   . Visual disturbance     Past Surgical History:  Procedure Laterality Date  . EYE SURGERY     R eye muscle surgery- 1980  . HERNIA REPAIR  1966   left  . NASAL SINUS SURGERY  1974  . 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 UMBILICAL HERNIA REPAIR  10/28/2011   Procedure: HERNIA REPAIR UMBILICAL ADULT;  Surgeon: Judieth Keens, DO;  Location: [facility];  Service: General;  Laterality: N/A;  Open umbilical hernia repair with mesh    There were no vitals filed for this visit.   Subjective Assessment - 06/21/19 1456    Subjective  The patient reports he had initial onset of vertigo January 2019 and was dx with water on his ear.  He reports he was on a ladder putting a ceiling fan up and got dizziness after looking up for 10 minutes.  That spell improved  after minutes.  On Monday, he went to lay back in the recliner and the room began to spin.  He reports laying still helped settle dizziness some, but he had ongoing symptoms and went to MD office 3-4 hours after onset.  He reports n/v when at MD office.    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    Currently in Pain?  No/denies         [facility] PT Assessment - 06/21/19 1502      Assessment   Medical Diagnosis  vertigo    Referring Provider (PT)  Shirline Frees, MD    Onset Date/Surgical Date  06/18/19    Prior Therapy  none      Precautions   Precautions  Fall      Restrictions   Weight Bearing Restrictions  No      Balance Screen   Has the patient fallen in the past 6 months  Yes    How many times?  2- on 06/18/19 at onset of vertigo  Has the patient had a decrease in activity level because of a fear of falling?   Yes   unable to mow, do yard work   Is the patient reluctant to leave their home because of a fear of falling?   No      Home Environment   Living Environment  Private residence    Living Arrangements  Spouse/significant other   caregiver for his wife   Type of [place] to enter    Entrance Stairs-Number of Steps  2    [place]  One level      Prior Function   Level of Independence  Independent      Ambulation/Gait   Ambulation/Gait  Yes    Ambulation/Gait Assistance  6: Modified independent (Device/Increase time)    Ambulation Distance (Feet)  300 Feet    Assistive device  None    Gait Pattern  Step-through pattern;Wide base of support    Ambulation Surface  Level;Indoor    Gait velocity  3.87 ft/sec    Gait Comments  Patient uses a wider base of support to compensate for imbalance during gait.           Vestibular Assessment - 06/21/19 1504      Vestibular Assessment   General Observation  Patient with n/v on  Monday when examined at MD office.  L eye diminsihed vision, R eye absent vision since birth.  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       Symptom Behavior   Subjective history of current problem  Onset of positional symptoms on n8/31/2020    Type of Dizziness   Spinning    Frequency of Dizziness  daily    Duration of Dizziness  Hours of dizziness Monday;  spells have been brief worse when standing and beginning to walk    Symptom Nature  Positional;Constant    Aggravating Factors  Activity in general;Supine to sit    Relieving Factors  Head stationary;Lying supine    Progression of Symptoms  Better      Oculomotor Exam   Oculomotor Alignment  Abnormal   R eye closed / congenital blindness   Ocular ROM  L eye WFLs    Spontaneous  Absent    Gaze-induced   Left beating nystagmus with L gaze    Smooth Pursuits  Saccades   tracking to the left side   Saccades  Intact      Vestibulo-Ocular Reflex   Comment  head impulse testing limited due to neck tightness/guarding; patient does not have ability to maintain fixation to target      Positional Testing   Dix-Hallpike  Dix-Hallpike Right;Dix-Hallpike Left    Horizontal Canal Testing  Horizontal Canal Right;Horizontal Canal Left      Dix-Hallpike Right   Dix-Hallpike Right Duration  0    Dix-Hallpike Right Symptoms  No nystagmus      Dix-Hallpike Left   Dix-Hallpike Left Duration  0    Dix-Hallpike Left Symptoms  No nystagmus      Horizontal Canal Right   Horizontal Canal Right Duration  0    Horizontal Canal Right Symptoms  Normal      Horizontal Canal Left   Horizontal Canal Left Duration  0    Horizontal Canal Left Symptoms  Normal          Objective measurements completed on examination: See above findings.      [place]  Adult PT Treatment/Exercise - 06/22/19 1212      Neuro Re-ed    Neuro Re-ed Details   Tandem stance also added to HEP near support surface.      Vestibular Treatment/Exercise - 06/22/19  1211      Vestibular Treatment/Exercise   Vestibular Treatment Provided  Gaze;Habituation    Habituation Exercises  Standing Horizontal Head Turns    Gaze Exercises  X1 Viewing Horizontal      Standing Horizontal Head Turns   Number of Reps   10    Symptom Description   Standing with feet together for balance challenge near support surface.      X1 Viewing Horizontal   Foot Position  standing feet apart x 30 seconds             PT Education - 06/22/19 0833    Education Details  initial HEP    Person(s) Educated  Patient    Methods  Explanation;Demonstration;Handout    Comprehension  Verbalized understanding;Returned demonstration       PT Short Term Goals - 06/22/19 1209      PT SHORT TERM GOAL #1   Title  The patient will be indep with initial HEP for gaze adaptation, balance and habituation.    Time  4    Period  Weeks    Target Date  07/21/19      PT SHORT TERM GOAL #2   Title  The patient will tolerate gaze adpatation x 1 viewing x 60 seconds without c/o dizziness.    Time  4    Period  Weeks    Target Date  07/21/19        PT Long Term Goals - 06/22/19 1213      PT LONG TERM GOAL #1   Title  The patient will be indep with HEP progression.    Time  6    Period  Weeks    Target Date  08/05/19      PT LONG TERM GOAL #2   Title  The patient will subjectively report dizziness improved > 75% since evaluation.    Time  6    Period  Weeks    Target Date  08/05/19      PT LONG TERM GOAL #3   Title  The patient will subjectively report return to performing IADLs including yard work and driving.    Time  6    Period  Weeks    Target Date  08/05/19      PT LONG TERM GOAL #4   Title  The patient will be further assessed on [place] and goal to follow, as indicated.    Time  6    Period  Weeks    Target Date  08/05/19             Plan - 06/22/19 1217    Clinical Impression Statement  The patient is a 78 year old male presenting to outpatient  physical therapy with sudden onset of vertigo 06/18/19 that lasted x hours with n/v and imbalance.  His oculomotor exam is limited due to h/o R congenital blindness.  He does continue with a L beating nystagmus with L gaze noted in the L eye (R eye remains shut) indicating R vestibular hypofunction.  He also has imbalance during walking and compensates with wider base of support. He has significant reduction in activity and PT encouraged participation in household walking/moving around as long as it is safe for him.  This  will encourage vestibular adaptation and compensation.    Personal Factors and Comorbidities  Comorbidity 1    Comorbidities  diabetes    Examination-Activity Limitations  Bed Mobility;Locomotion Level;Caring for Others    Examination-Participation Restrictions  Cleaning;Community Activity;Driving    Stability/Clinical Decision Making  Stable/Uncomplicated    Clinical Decision Making  Low    Rehab Potential  Good    PT Frequency  1x / week    PT Duration  6 weeks    PT Treatment/Interventions  ADLs/Self Care Home Management;Neuromuscular re-education;Gait training;Stair training;Functional mobility training;Therapeutic activities;Therapeutic exercise;Balance training;Canalith Repostioning;Vestibular;Patient/family education    PT Next Visit Plan  check HEP, progress habituation activities, progress gaze adaptation, progress balance training.  Dynamic gait activities.    Consulted and Agree with Plan of Care  Patient       Patient will benefit from skilled therapeutic intervention in order to improve the following deficits and impairments:  Abnormal gait, Dizziness, Decreased balance  Visit Diagnosis: Dizziness and giddiness  Other abnormalities of gait and mobility  Unsteadiness on feet     Problem List There are no active problems to display for this patient.   [place], [facility] 06/22/2019, 12:24 PM  [facility] [address] [place], [place], [address] Phone: [phone]   Fax:  [phone]  Name: FLINT ETTER MRN: [id number] Date of Birth: 17-Feb-1941

## 2019-07-05 DIAGNOSIS — R42 Dizziness and giddiness: Secondary | ICD-10-CM | POA: Diagnosis not present

## 2019-07-05 DIAGNOSIS — H6123 Impacted cerumen, bilateral: Secondary | ICD-10-CM | POA: Diagnosis not present

## 2019-07-05 DIAGNOSIS — J343 Hypertrophy of nasal turbinates: Secondary | ICD-10-CM | POA: Diagnosis not present

## 2019-07-12 ENCOUNTER — Ambulatory Visit: Payer: PPO | Admitting: Rehabilitative and Restorative Service Providers"

## 2019-07-16 DIAGNOSIS — L570 Actinic keratosis: Secondary | ICD-10-CM | POA: Diagnosis not present

## 2019-07-16 DIAGNOSIS — X32XXXD Exposure to sunlight, subsequent encounter: Secondary | ICD-10-CM | POA: Diagnosis not present

## 2019-07-16 DIAGNOSIS — C44722 Squamous cell carcinoma of skin of right lower limb, including hip: Secondary | ICD-10-CM | POA: Diagnosis not present

## 2019-07-16 DIAGNOSIS — L820 Inflamed seborrheic keratosis: Secondary | ICD-10-CM | POA: Diagnosis not present

## 2019-07-19 ENCOUNTER — Ambulatory Visit: Payer: Self-pay | Admitting: Physical Therapy

## 2019-07-23 ENCOUNTER — Encounter: Payer: Self-pay | Admitting: Rehabilitative and Restorative Service Providers"

## 2019-07-30 ENCOUNTER — Encounter: Payer: Self-pay | Admitting: Rehabilitative and Restorative Service Providers"

## 2019-10-17 DIAGNOSIS — C44722 Squamous cell carcinoma of skin of right lower limb, including hip: Secondary | ICD-10-CM | POA: Diagnosis not present

## 2019-10-17 DIAGNOSIS — L728 Other follicular cysts of the skin and subcutaneous tissue: Secondary | ICD-10-CM | POA: Diagnosis not present

## 2019-10-17 DIAGNOSIS — C44729 Squamous cell carcinoma of skin of left lower limb, including hip: Secondary | ICD-10-CM | POA: Diagnosis not present

## 2019-10-17 DIAGNOSIS — L218 Other seborrheic dermatitis: Secondary | ICD-10-CM | POA: Diagnosis not present

## 2019-12-13 DIAGNOSIS — F419 Anxiety disorder, unspecified: Secondary | ICD-10-CM | POA: Diagnosis not present

## 2019-12-13 DIAGNOSIS — E119 Type 2 diabetes mellitus without complications: Secondary | ICD-10-CM | POA: Diagnosis not present

## 2019-12-13 DIAGNOSIS — E7800 Pure hypercholesterolemia, unspecified: Secondary | ICD-10-CM | POA: Diagnosis not present

## 2020-01-07 DIAGNOSIS — Z86010 Personal history of colonic polyps: Secondary | ICD-10-CM | POA: Diagnosis not present

## 2020-02-18 DIAGNOSIS — J019 Acute sinusitis, unspecified: Secondary | ICD-10-CM | POA: Diagnosis not present

## 2020-03-07 DIAGNOSIS — Z1159 Encounter for screening for other viral diseases: Secondary | ICD-10-CM | POA: Diagnosis not present

## 2020-03-12 DIAGNOSIS — Z86010 Personal history of colonic polyps: Secondary | ICD-10-CM | POA: Diagnosis not present

## 2020-03-20 DIAGNOSIS — L237 Allergic contact dermatitis due to plants, except food: Secondary | ICD-10-CM | POA: Diagnosis not present

## 2020-06-12 DIAGNOSIS — F419 Anxiety disorder, unspecified: Secondary | ICD-10-CM | POA: Diagnosis not present

## 2020-06-12 DIAGNOSIS — E119 Type 2 diabetes mellitus without complications: Secondary | ICD-10-CM | POA: Diagnosis not present

## 2020-06-12 DIAGNOSIS — Z0000 Encounter for general adult medical examination without abnormal findings: Secondary | ICD-10-CM | POA: Diagnosis not present

## 2020-06-12 DIAGNOSIS — M545 Low back pain: Secondary | ICD-10-CM | POA: Diagnosis not present

## 2020-06-12 DIAGNOSIS — E7800 Pure hypercholesterolemia, unspecified: Secondary | ICD-10-CM | POA: Diagnosis not present

## 2020-07-17 DIAGNOSIS — M5441 Lumbago with sciatica, right side: Secondary | ICD-10-CM | POA: Diagnosis not present

## 2020-09-01 DIAGNOSIS — C44729 Squamous cell carcinoma of skin of left lower limb, including hip: Secondary | ICD-10-CM | POA: Diagnosis not present

## 2020-09-16 DIAGNOSIS — M5416 Radiculopathy, lumbar region: Secondary | ICD-10-CM | POA: Diagnosis not present

## 2020-09-25 ENCOUNTER — Other Ambulatory Visit: Payer: Self-pay | Admitting: Family Medicine

## 2020-09-25 DIAGNOSIS — M5416 Radiculopathy, lumbar region: Secondary | ICD-10-CM

## 2020-09-30 ENCOUNTER — Ambulatory Visit
Admission: RE | Admit: 2020-09-30 | Discharge: 2020-09-30 | Disposition: A | Discharge status: Home / Self Care | Payer: PPO | Source: Ambulatory Visit | Attending: Family Medicine | Admitting: Family Medicine

## 2020-09-30 ENCOUNTER — Other Ambulatory Visit: Payer: Self-pay

## 2020-09-30 DIAGNOSIS — M48061 Spinal stenosis, lumbar region without neurogenic claudication: Secondary | ICD-10-CM | POA: Diagnosis not present

## 2020-09-30 DIAGNOSIS — M5416 Radiculopathy, lumbar region: Secondary | ICD-10-CM

## 2020-10-06 ENCOUNTER — Other Ambulatory Visit: Payer: Self-pay

## 2020-10-07 DIAGNOSIS — M5126 Other intervertebral disc displacement, lumbar region: Secondary | ICD-10-CM | POA: Diagnosis not present

## 2020-10-13 DIAGNOSIS — M5126 Other intervertebral disc displacement, lumbar region: Secondary | ICD-10-CM | POA: Diagnosis not present

## 2021-01-27 DIAGNOSIS — F419 Anxiety disorder, unspecified: Secondary | ICD-10-CM | POA: Diagnosis not present

## 2021-01-27 DIAGNOSIS — E7800 Pure hypercholesterolemia, unspecified: Secondary | ICD-10-CM | POA: Diagnosis not present

## 2021-01-27 DIAGNOSIS — E119 Type 2 diabetes mellitus without complications: Secondary | ICD-10-CM | POA: Diagnosis not present

## 2021-02-16 DIAGNOSIS — C44722 Squamous cell carcinoma of skin of right lower limb, including hip: Secondary | ICD-10-CM | POA: Diagnosis not present

## 2021-03-25 DIAGNOSIS — Z08 Encounter for follow-up examination after completed treatment for malignant neoplasm: Secondary | ICD-10-CM | POA: Diagnosis not present

## 2021-03-25 DIAGNOSIS — Z85828 Personal history of other malignant neoplasm of skin: Secondary | ICD-10-CM | POA: Diagnosis not present

## 2021-06-18 DIAGNOSIS — E7800 Pure hypercholesterolemia, unspecified: Secondary | ICD-10-CM | POA: Diagnosis not present

## 2021-06-18 DIAGNOSIS — Z0000 Encounter for general adult medical examination without abnormal findings: Secondary | ICD-10-CM | POA: Diagnosis not present

## 2021-06-18 DIAGNOSIS — E119 Type 2 diabetes mellitus without complications: Secondary | ICD-10-CM | POA: Diagnosis not present

## 2021-06-18 DIAGNOSIS — Z23 Encounter for immunization: Secondary | ICD-10-CM | POA: Diagnosis not present

## 2021-06-18 DIAGNOSIS — F419 Anxiety disorder, unspecified: Secondary | ICD-10-CM | POA: Diagnosis not present

## 2021-08-15 IMAGING — MR MR LUMBAR SPINE W/O CM
4 of 5 series · 19 of 48 positions shown · non-contrast
Comparison: None.

CLINICAL DATA: Lumbar radiculopathy.

EXAM:
MRI LUMBAR SPINE WITHOUT CONTRAST
TECHNIQUE: Multiplanar, multisequence MR imaging of the lumbar spine was
performed. No intravenous contrast was administered.

[Series 5: T2 · sagittal · 4.0mm · 0.88mm/px · 6 of 18 slices shown (1 of 2)]
[im 1/18]
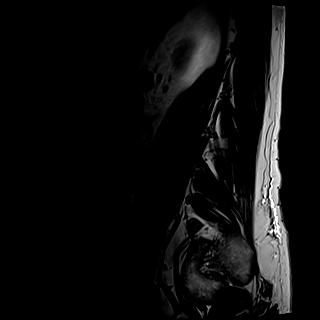
[im 4/18]
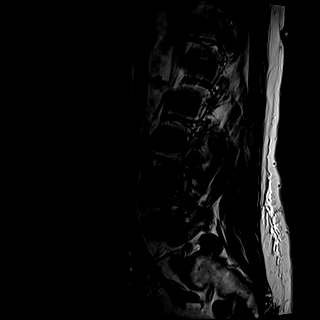
[im 7/18]
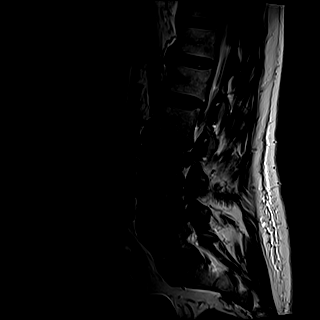
[im 11/18]
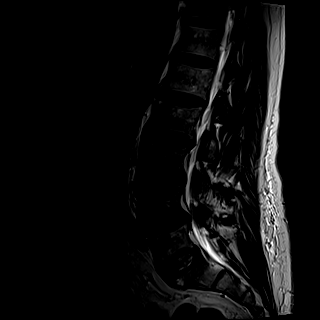
[im 14/18]
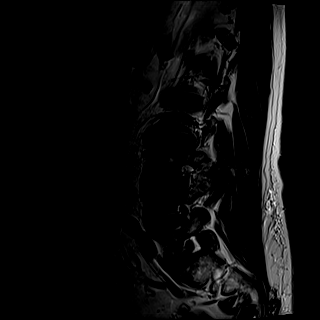
[im 18/18]
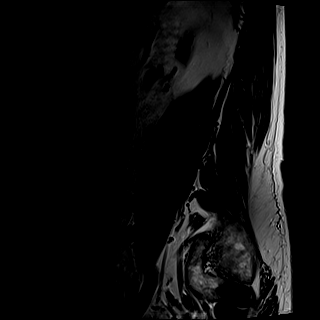

[Series 6: T1 · sagittal · 4.0mm · 0.88mm/px · 3 of 18 slices shown (1 of 2)]
[im 4/18]
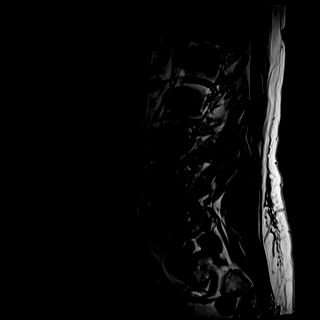
[im 11/18]
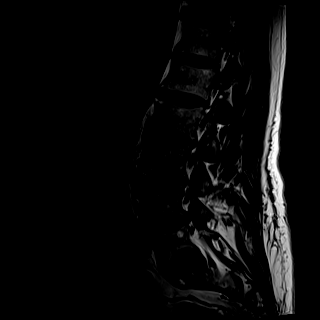
[im 18/18]
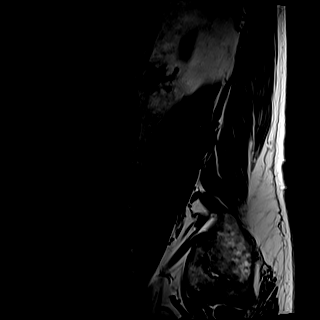

[Series 10: T2 · axial · 4.0mm · 0.28mm/px · z∈[-102,+101]mm · 7 of 43 slices shown (2 of 2)]
[im 1/43]
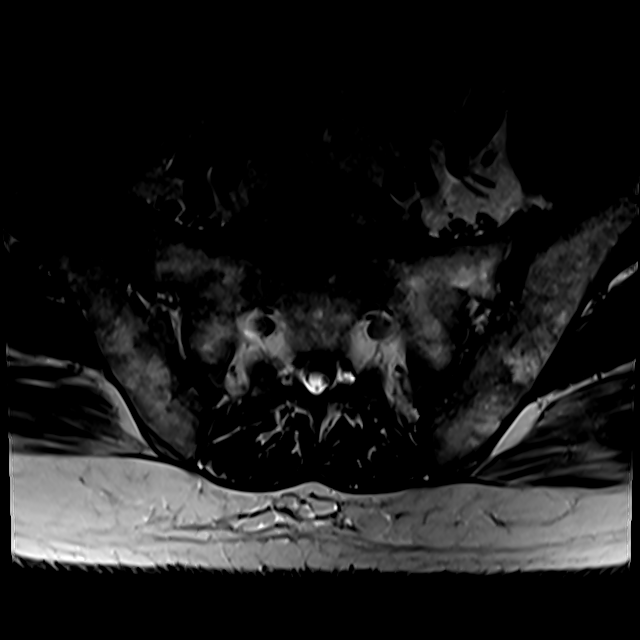
[im 7/43]
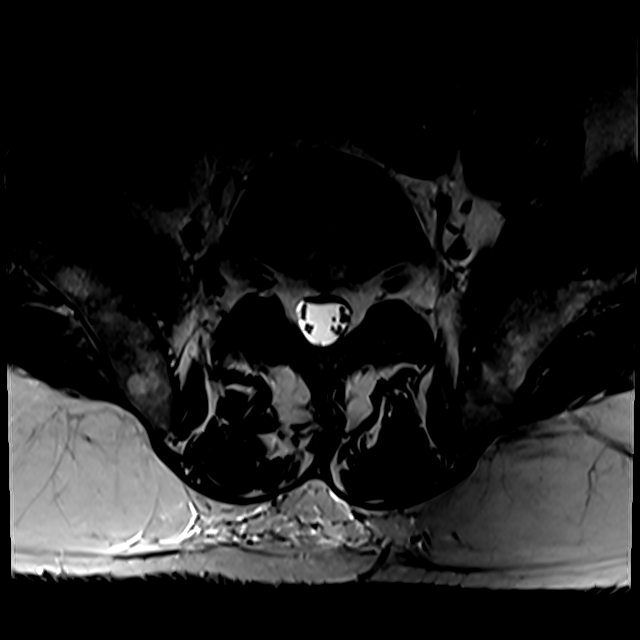
[im 13/43]
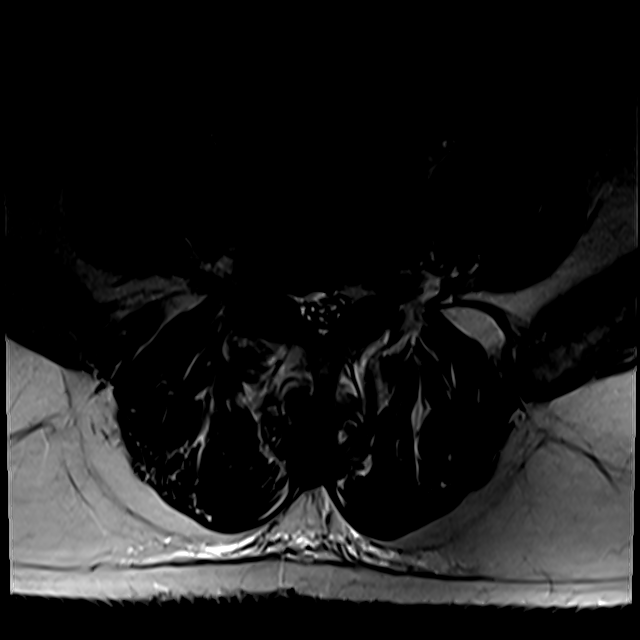
[im 19/43]
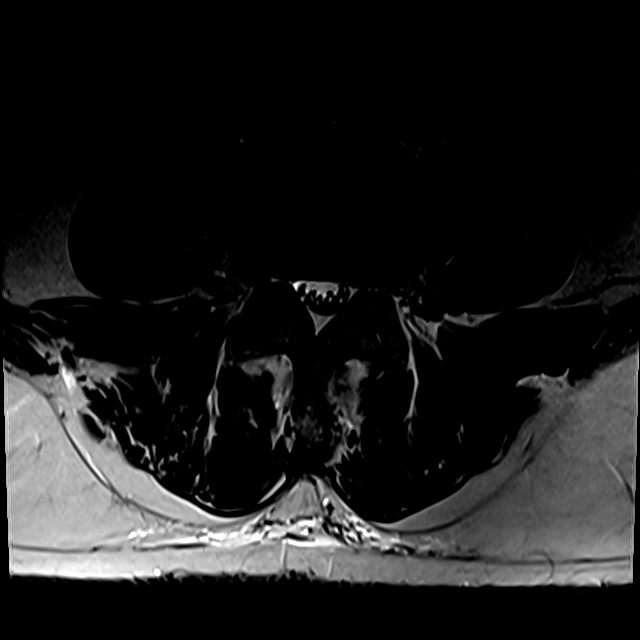
[im 22/43]
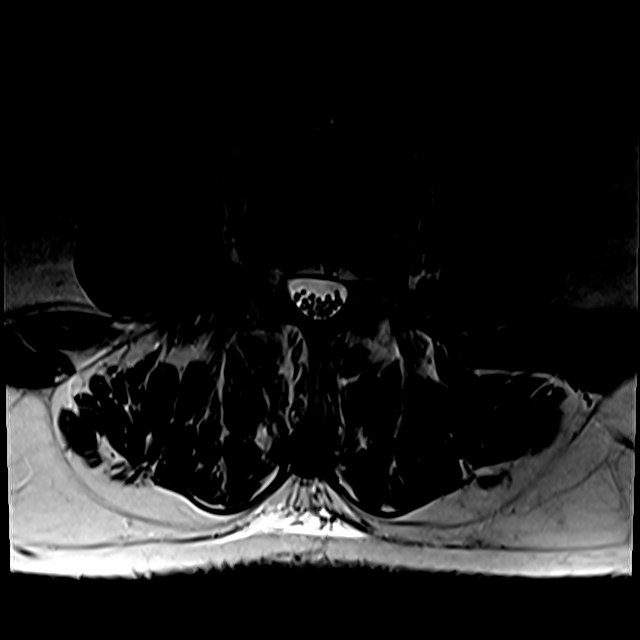
[im 25/43]
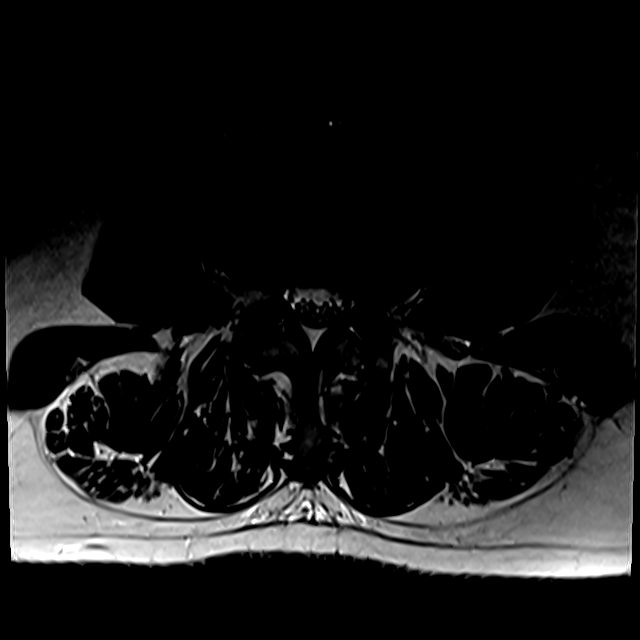
[im 37/43]
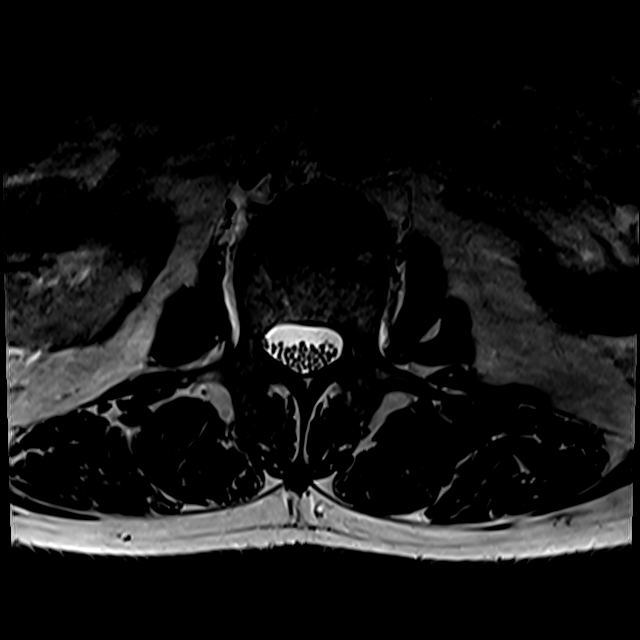

[Series 13: T1 · axial · 4.0mm · 0.28mm/px · z∈[-73,+101]mm · 3 of 43 slices shown (2 of 2)]
[im 7/43]
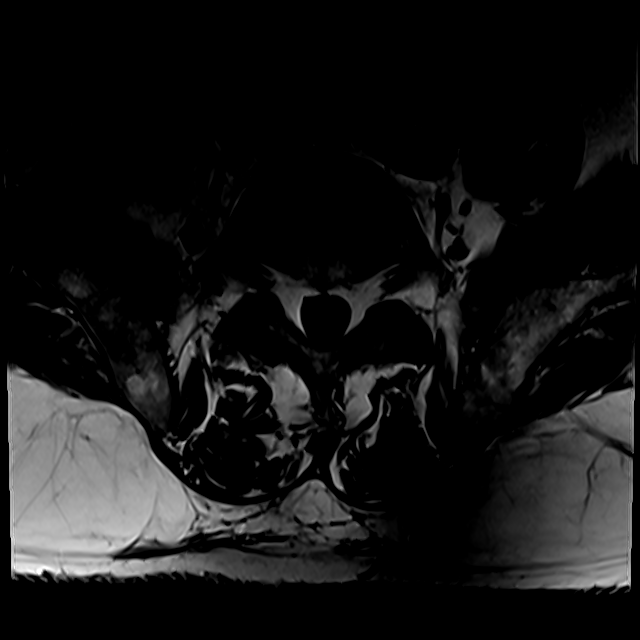
[im 22/43]
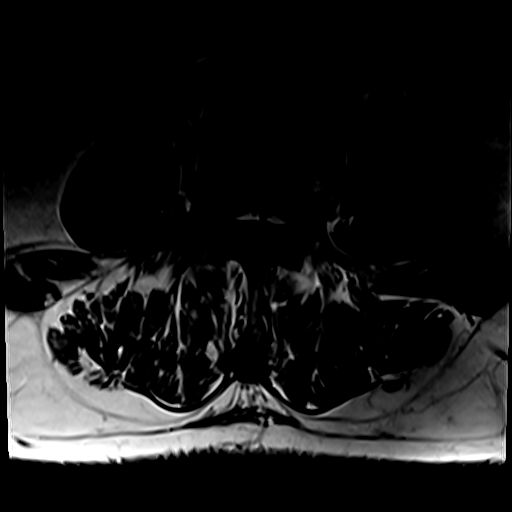
[im 37/43]
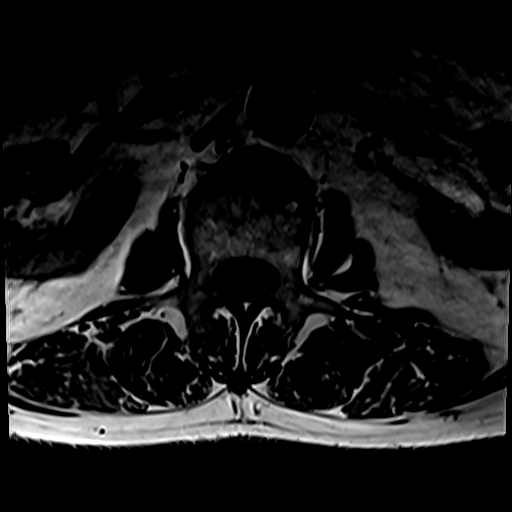

[19 of 48 positions shown; findings below may reference images not displayed]

FINDINGS: Segmentation:  Standard.

Alignment:  Physiologic.

Vertebrae:  No fracture, evidence of discitis, or bone lesion.

Conus medullaris and cauda equina: Conus extends to the T12 level.
Conus and cauda equina appear normal.

Paraspinal and other soft tissues: Negative.

Disc levels:

T12-L1: No spinal canal or neural foraminal stenosis.

L1-2: Shallow disc bulge and mild facet degenerative changes without
significant spinal canal or neural foraminal stenosis.

L2-3 shallow disc bulge with superimposed small left foraminal/far
lateral disc protrusion with associated annular tear which may
contact the exiting left L2 nerve root. There also mild facet
degenerative changes and ligamentum flavum redundancy contributing
for narrowing of the bilateral subarticular zones and mild bilateral
neural foraminal narrowing, left greater than right.

L3-4: Disc bulge, facet degenerative changes and ligamentum flavum
redundancy resulting in mild spinal canal stenosis with narrowing of
the bilateral subarticular zones and mild bilateral neural foraminal
narrowing.

L4-5: Disc bulge with superimposed right foraminal disc extrusion
migrating superiorly and into the right neural foramen causing
impingement on the exiting right L4 nerve root. Facet degenerative
changes and ligamentum flavum redundancy also contribute for mild to
moderate spinal canal stenosis with narrowing of the bilateral
subarticular zones, severe right and mild left neural foraminal
narrowing.

L5-S1: Shallow disc bulge and mild facet degenerative changes, right
greater than left, without significant spinal canal or neural
foraminal stenosis.
IMPRESSION: 1. Multilevel degenerative changes of the lumbar spine as described
above, worst at L4-L5 where there is a right foraminal disc
extrusion migrating superiorly and into the right neural foramen
causing impingement on the exiting right L4 nerve root. There is
also mild to moderate spinal canal stenosis at this level with
narrowing of the bilateral subarticular zones.
2. Mild spinal canal stenosis at L3-4 with narrowing of the
bilateral subarticular zones and mild bilateral neural foraminal
narrowing.
3. Small left foraminal/far lateral disc protrusion at L2-L3 with
associated annular tear which may contact the exiting left L2 nerve
root.

## 2021-09-02 DIAGNOSIS — C44722 Squamous cell carcinoma of skin of right lower limb, including hip: Secondary | ICD-10-CM | POA: Diagnosis not present

## 2022-03-12 DIAGNOSIS — E119 Type 2 diabetes mellitus without complications: Secondary | ICD-10-CM | POA: Diagnosis not present

## 2022-03-12 DIAGNOSIS — B356 Tinea cruris: Secondary | ICD-10-CM | POA: Diagnosis not present

## 2022-03-12 DIAGNOSIS — F419 Anxiety disorder, unspecified: Secondary | ICD-10-CM | POA: Diagnosis not present

## 2022-03-12 DIAGNOSIS — E7800 Pure hypercholesterolemia, unspecified: Secondary | ICD-10-CM | POA: Diagnosis not present

## 2022-03-12 DIAGNOSIS — H6123 Impacted cerumen, bilateral: Secondary | ICD-10-CM | POA: Diagnosis not present

## 2022-05-10 DIAGNOSIS — L570 Actinic keratosis: Secondary | ICD-10-CM | POA: Diagnosis not present

## 2022-05-10 DIAGNOSIS — X32XXXD Exposure to sunlight, subsequent encounter: Secondary | ICD-10-CM | POA: Diagnosis not present

## 2022-05-10 DIAGNOSIS — C44722 Squamous cell carcinoma of skin of right lower limb, including hip: Secondary | ICD-10-CM | POA: Diagnosis not present

## 2022-07-05 DIAGNOSIS — E7800 Pure hypercholesterolemia, unspecified: Secondary | ICD-10-CM | POA: Diagnosis not present

## 2022-07-05 DIAGNOSIS — Z23 Encounter for immunization: Secondary | ICD-10-CM | POA: Diagnosis not present

## 2022-07-05 DIAGNOSIS — Z0000 Encounter for general adult medical examination without abnormal findings: Secondary | ICD-10-CM | POA: Diagnosis not present

## 2022-07-05 DIAGNOSIS — E119 Type 2 diabetes mellitus without complications: Secondary | ICD-10-CM | POA: Diagnosis not present

## 2022-07-05 DIAGNOSIS — F419 Anxiety disorder, unspecified: Secondary | ICD-10-CM | POA: Diagnosis not present

## 2022-09-13 DIAGNOSIS — Z961 Presence of intraocular lens: Secondary | ICD-10-CM | POA: Diagnosis not present

## 2022-09-22 DIAGNOSIS — C44629 Squamous cell carcinoma of skin of left upper limb, including shoulder: Secondary | ICD-10-CM | POA: Diagnosis not present

## 2022-09-22 DIAGNOSIS — C44729 Squamous cell carcinoma of skin of left lower limb, including hip: Secondary | ICD-10-CM | POA: Diagnosis not present

## 2022-09-22 DIAGNOSIS — C44722 Squamous cell carcinoma of skin of right lower limb, including hip: Secondary | ICD-10-CM | POA: Diagnosis not present

## 2022-10-06 DIAGNOSIS — C44629 Squamous cell carcinoma of skin of left upper limb, including shoulder: Secondary | ICD-10-CM | POA: Diagnosis not present

## 2023-01-13 DIAGNOSIS — E7800 Pure hypercholesterolemia, unspecified: Secondary | ICD-10-CM | POA: Diagnosis not present

## 2023-01-13 DIAGNOSIS — E119 Type 2 diabetes mellitus without complications: Secondary | ICD-10-CM | POA: Diagnosis not present

## 2023-01-13 DIAGNOSIS — H8113 Benign paroxysmal vertigo, bilateral: Secondary | ICD-10-CM | POA: Diagnosis not present

## 2023-01-13 DIAGNOSIS — F419 Anxiety disorder, unspecified: Secondary | ICD-10-CM | POA: Diagnosis not present

## 2023-02-02 DIAGNOSIS — C44729 Squamous cell carcinoma of skin of left lower limb, including hip: Secondary | ICD-10-CM | POA: Diagnosis not present

## 2023-02-02 DIAGNOSIS — X32XXXD Exposure to sunlight, subsequent encounter: Secondary | ICD-10-CM | POA: Diagnosis not present

## 2023-02-02 DIAGNOSIS — L570 Actinic keratosis: Secondary | ICD-10-CM | POA: Diagnosis not present

## 2023-02-02 DIAGNOSIS — C44722 Squamous cell carcinoma of skin of right lower limb, including hip: Secondary | ICD-10-CM | POA: Diagnosis not present

## 2023-03-02 DIAGNOSIS — X32XXXD Exposure to sunlight, subsequent encounter: Secondary | ICD-10-CM | POA: Diagnosis not present

## 2023-03-02 DIAGNOSIS — Z08 Encounter for follow-up examination after completed treatment for malignant neoplasm: Secondary | ICD-10-CM | POA: Diagnosis not present

## 2023-03-02 DIAGNOSIS — L570 Actinic keratosis: Secondary | ICD-10-CM | POA: Diagnosis not present

## 2023-03-02 DIAGNOSIS — D225 Melanocytic nevi of trunk: Secondary | ICD-10-CM | POA: Diagnosis not present

## 2023-03-02 DIAGNOSIS — L720 Epidermal cyst: Secondary | ICD-10-CM | POA: Diagnosis not present

## 2023-03-02 DIAGNOSIS — Z85828 Personal history of other malignant neoplasm of skin: Secondary | ICD-10-CM | POA: Diagnosis not present

## 2023-04-04 DIAGNOSIS — H00014 Hordeolum externum left upper eyelid: Secondary | ICD-10-CM | POA: Diagnosis not present

## 2023-04-12 DIAGNOSIS — H00014 Hordeolum externum left upper eyelid: Secondary | ICD-10-CM | POA: Diagnosis not present

## 2023-06-27 DIAGNOSIS — C44722 Squamous cell carcinoma of skin of right lower limb, including hip: Secondary | ICD-10-CM | POA: Diagnosis not present

## 2023-06-27 DIAGNOSIS — L570 Actinic keratosis: Secondary | ICD-10-CM | POA: Diagnosis not present

## 2023-06-27 DIAGNOSIS — X32XXXD Exposure to sunlight, subsequent encounter: Secondary | ICD-10-CM | POA: Diagnosis not present

## 2023-07-06 DIAGNOSIS — Z961 Presence of intraocular lens: Secondary | ICD-10-CM | POA: Diagnosis not present

## 2023-07-06 DIAGNOSIS — H43812 Vitreous degeneration, left eye: Secondary | ICD-10-CM | POA: Diagnosis not present

## 2023-07-19 DIAGNOSIS — F419 Anxiety disorder, unspecified: Secondary | ICD-10-CM | POA: Diagnosis not present

## 2023-07-19 DIAGNOSIS — E7800 Pure hypercholesterolemia, unspecified: Secondary | ICD-10-CM | POA: Diagnosis not present

## 2023-07-19 DIAGNOSIS — H8113 Benign paroxysmal vertigo, bilateral: Secondary | ICD-10-CM | POA: Diagnosis not present

## 2023-07-19 DIAGNOSIS — Z0000 Encounter for general adult medical examination without abnormal findings: Secondary | ICD-10-CM | POA: Diagnosis not present

## 2023-07-19 DIAGNOSIS — E119 Type 2 diabetes mellitus without complications: Secondary | ICD-10-CM | POA: Diagnosis not present

## 2023-07-19 DIAGNOSIS — Z23 Encounter for immunization: Secondary | ICD-10-CM | POA: Diagnosis not present

## 2024-03-07 DIAGNOSIS — J069 Acute upper respiratory infection, unspecified: Secondary | ICD-10-CM | POA: Diagnosis not present

## 2024-03-07 DIAGNOSIS — R051 Acute cough: Secondary | ICD-10-CM | POA: Diagnosis not present

## 2024-03-17 DIAGNOSIS — E119 Type 2 diabetes mellitus without complications: Secondary | ICD-10-CM | POA: Diagnosis not present

## 2024-03-17 DIAGNOSIS — E7800 Pure hypercholesterolemia, unspecified: Secondary | ICD-10-CM | POA: Diagnosis not present

## 2024-05-03 ENCOUNTER — Ambulatory Visit: Admitting: Podiatry

## 2024-05-03 ENCOUNTER — Encounter: Payer: Self-pay | Admitting: Podiatry

## 2024-05-03 DIAGNOSIS — M25572 Pain in left ankle and joints of left foot: Secondary | ICD-10-CM

## 2024-05-03 DIAGNOSIS — B351 Tinea unguium: Secondary | ICD-10-CM | POA: Diagnosis not present

## 2024-05-03 DIAGNOSIS — M79672 Pain in left foot: Secondary | ICD-10-CM | POA: Diagnosis not present

## 2024-05-03 DIAGNOSIS — M25571 Pain in right ankle and joints of right foot: Secondary | ICD-10-CM

## 2024-05-03 DIAGNOSIS — M79671 Pain in right foot: Secondary | ICD-10-CM | POA: Diagnosis not present

## 2024-05-03 NOTE — Progress Notes (Signed)
 Patient presents for evaluation and treatment of tenderness and some redness around nails feet.  Tenderness around toes with walking and wearing shoes.  He had some pain at the bunion sites bilaterally.  Pain with walking and wearing shoes at times  Physical exam:  General appearance: Alert, pleasant, and in no acute distress.  Vascular: Pedal pulses: DP 2/4 B/L, PT 1/4 B/L.  Mild edema lower legs bilaterally.  Capillary refill time immediate bilateral  Neurological:  Light touch intact Achilles tendon reflex normal  Dermatologic:  Nails thickened, disfigured, discolored 1-5 BL with subungual debris.  Redness and hypertrophic nail folds along nail folds bilaterally but no signs of drainage or infection.  Musculoskeletal:  HAV deformity bilaterally.  Hallux interphalangeous deformity bilaterally   Diagnosis: 1. Painful onychomycotic nails 1 through 5 bilaterally. 2. Pain toes 1 through 5 bilaterally. 3.  Arthralgia first MTP bilaterally  Plan: -New patient office visit level 3.  Modifier 25. - Discussed with him the onychomycotic nails.  Recommended periodic debridement of the nails to keep him comfortable and keep from hurting and interfering with activities of daily living.  Also discussed the HAV deformities such as wear shoes that accommodate this wear shoes with good padding and support.  If these do become more of a problem we can address these.  -Debrided onychomycotic nails 1 through 5 bilaterally.  Return 3 months [facility]

## 2024-05-17 DIAGNOSIS — E119 Type 2 diabetes mellitus without complications: Secondary | ICD-10-CM | POA: Diagnosis not present

## 2024-05-17 DIAGNOSIS — E7800 Pure hypercholesterolemia, unspecified: Secondary | ICD-10-CM | POA: Diagnosis not present

## 2024-06-17 DIAGNOSIS — E119 Type 2 diabetes mellitus without complications: Secondary | ICD-10-CM | POA: Diagnosis not present

## 2024-06-17 DIAGNOSIS — E7800 Pure hypercholesterolemia, unspecified: Secondary | ICD-10-CM | POA: Diagnosis not present

## 2024-07-06 DIAGNOSIS — J0100 Acute maxillary sinusitis, unspecified: Secondary | ICD-10-CM | POA: Diagnosis not present

## 2024-07-17 DIAGNOSIS — E7800 Pure hypercholesterolemia, unspecified: Secondary | ICD-10-CM | POA: Diagnosis not present

## 2024-07-17 DIAGNOSIS — E119 Type 2 diabetes mellitus without complications: Secondary | ICD-10-CM | POA: Diagnosis not present

## 2024-07-31 DIAGNOSIS — E7800 Pure hypercholesterolemia, unspecified: Secondary | ICD-10-CM | POA: Diagnosis not present

## 2024-07-31 DIAGNOSIS — Z0000 Encounter for general adult medical examination without abnormal findings: Secondary | ICD-10-CM | POA: Diagnosis not present

## 2024-07-31 DIAGNOSIS — H8113 Benign paroxysmal vertigo, bilateral: Secondary | ICD-10-CM | POA: Diagnosis not present

## 2024-07-31 DIAGNOSIS — Z23 Encounter for immunization: Secondary | ICD-10-CM | POA: Diagnosis not present

## 2024-07-31 DIAGNOSIS — E119 Type 2 diabetes mellitus without complications: Secondary | ICD-10-CM | POA: Diagnosis not present

## 2024-07-31 DIAGNOSIS — F419 Anxiety disorder, unspecified: Secondary | ICD-10-CM | POA: Diagnosis not present

## 2024-08-02 ENCOUNTER — Ambulatory Visit: Admitting: Podiatry

## 2024-08-06 ENCOUNTER — Ambulatory Visit: Admitting: Podiatry

## 2024-08-06 DIAGNOSIS — M79671 Pain in right foot: Secondary | ICD-10-CM | POA: Diagnosis not present

## 2024-08-06 DIAGNOSIS — M79672 Pain in left foot: Secondary | ICD-10-CM

## 2024-08-06 DIAGNOSIS — B351 Tinea unguium: Secondary | ICD-10-CM | POA: Diagnosis not present

## 2024-08-06 NOTE — Progress Notes (Signed)
 Patient presents for evaluation and treatment of tenderness and some redness around nails feet.  Tenderness around toes with walking and wearing shoes.  Physical exam:  General appearance: Alert, pleasant, and in no acute distress.  Vascular: Pedal pulses: DP 2/4 B/L, PT 1/4 B/L. Mild edema lower legs bilaterally  Neurologic:  Dermatologic:  Nails thickened, disfigured, discolored 1-5 BL with subungual debris.  Redness and hypertrophic nail folds along nail folds bilaterally but no signs of drainage or infection.  Musculoskeletal:     Diagnosis: 1. Painful onychomycotic nails 1 through 5 bilaterally. 2. Pain toes 1 through 5 bilaterally.  Plan: -Debrided onychomycotic nails 1 through 5 bilaterally.  Sharply debrided nails with nail clipper and reduced with a power bur.  Return 3 months  [facility]

## 2024-08-17 DIAGNOSIS — E119 Type 2 diabetes mellitus without complications: Secondary | ICD-10-CM | POA: Diagnosis not present

## 2024-08-17 DIAGNOSIS — E7800 Pure hypercholesterolemia, unspecified: Secondary | ICD-10-CM | POA: Diagnosis not present

## 2024-08-20 DIAGNOSIS — C44722 Squamous cell carcinoma of skin of right lower limb, including hip: Secondary | ICD-10-CM | POA: Diagnosis not present

## 2024-08-20 DIAGNOSIS — L570 Actinic keratosis: Secondary | ICD-10-CM | POA: Diagnosis not present

## 2024-08-20 DIAGNOSIS — L820 Inflamed seborrheic keratosis: Secondary | ICD-10-CM | POA: Diagnosis not present

## 2024-08-20 DIAGNOSIS — X32XXXD Exposure to sunlight, subsequent encounter: Secondary | ICD-10-CM | POA: Diagnosis not present

## 2024-09-17 DIAGNOSIS — Z85828 Personal history of other malignant neoplasm of skin: Secondary | ICD-10-CM | POA: Diagnosis not present

## 2024-09-17 DIAGNOSIS — Z08 Encounter for follow-up examination after completed treatment for malignant neoplasm: Secondary | ICD-10-CM | POA: Diagnosis not present

## 2024-10-29 NOTE — Progress Notes (Signed)
 Michael Pacheco                                          MRN: [id number]   10/29/2024   The [facility] Quality Team Specialist reviewed this patient medical record for the purposes of chart review for care gap closure. The following were reviewed: chart review for care gap closure-kidney health evaluation for diabetes:eGFR  and uACR.    [facility] Quality Team

## 2024-11-06 ENCOUNTER — Ambulatory Visit: Admitting: Podiatry

## 2024-11-06 DIAGNOSIS — M79674 Pain in right toe(s): Secondary | ICD-10-CM

## 2024-11-06 DIAGNOSIS — M79675 Pain in left toe(s): Secondary | ICD-10-CM

## 2024-11-06 DIAGNOSIS — B351 Tinea unguium: Secondary | ICD-10-CM

## 2024-11-06 NOTE — Progress Notes (Signed)
 Patient presents for evaluation and treatment of tenderness and some redness around nails feet.  Tenderness around toes with walking and wearing shoes.  Physical exam:  General appearance: Alert, pleasant, and in no acute distress.  Vascular: Pedal pulses: DP 2/4 B/L, PT 1/4 B/L. Mild edema lower legs bilaterally.  Capillary refill time immediate bilaterally  Neurologic:  Dermatologic:  Nails thickened, disfigured, discolored 1-5 BL with subungual debris.  Redness and hypertrophic nail folds along nail folds bilaterally but no signs of drainage or infection.  Musculoskeletal:     Diagnosis: 1. Painful onychomycotic nails 1 through 5 bilaterally. 2. Pain toes 1 through 5 bilaterally.  Plan: -Debrided onychomycotic nails 1 through 5 bilaterally.  Sharply debrided nails with nail clipper and reduced with a power bur.  Return 3 months [facility]

## 2025-02-04 ENCOUNTER — Ambulatory Visit: Admitting: Podiatry
